# Patient Record
Sex: Male | Born: 1960 | Race: White | Hispanic: No | Marital: Married | Smoking: Never smoker
Health system: Southern US, Community
[De-identification: ages and names within clinical notes are randomized; demographics above are authoritative.]

## PROBLEM LIST (undated history)

## (undated) DIAGNOSIS — I10 Essential (primary) hypertension: Secondary | ICD-10-CM

## (undated) DIAGNOSIS — I480 Paroxysmal atrial fibrillation: Secondary | ICD-10-CM

## (undated) DIAGNOSIS — E785 Hyperlipidemia, unspecified: Secondary | ICD-10-CM

## (undated) HISTORY — DX: Essential (primary) hypertension: I10

## (undated) HISTORY — DX: Hyperlipidemia, unspecified: E78.5

## (undated) HISTORY — DX: Paroxysmal atrial fibrillation: I48.0

---

## 1999-02-14 ENCOUNTER — Encounter: Payer: Self-pay | Admitting: Neurosurgery

## 1999-02-14 ENCOUNTER — Ambulatory Visit (HOSPITAL_COMMUNITY): Admission: RE | Admit: 1999-02-14 | Discharge: 1999-02-14 | Payer: Self-pay

## 2000-04-27 ENCOUNTER — Ambulatory Visit (HOSPITAL_COMMUNITY): Admission: RE | Admit: 2000-04-27 | Discharge: 2000-04-27 | Payer: Self-pay | Admitting: Neurosurgery

## 2000-04-27 ENCOUNTER — Encounter: Payer: Self-pay | Admitting: Neurosurgery

## 2003-11-24 ENCOUNTER — Encounter: Admission: RE | Admit: 2003-11-24 | Discharge: 2003-11-24 | Payer: Self-pay | Admitting: Neurosurgery

## 2004-05-29 ENCOUNTER — Encounter: Admission: RE | Admit: 2004-05-29 | Discharge: 2004-05-29 | Payer: Self-pay | Admitting: Neurosurgery

## 2005-06-25 ENCOUNTER — Ambulatory Visit: Payer: Self-pay

## 2005-07-09 ENCOUNTER — Ambulatory Visit: Payer: Self-pay

## 2005-07-09 ENCOUNTER — Encounter: Payer: Self-pay | Admitting: Cardiology

## 2005-07-26 ENCOUNTER — Ambulatory Visit: Payer: Self-pay | Admitting: Internal Medicine

## 2005-08-17 ENCOUNTER — Ambulatory Visit: Payer: Self-pay | Admitting: Internal Medicine

## 2005-12-13 ENCOUNTER — Ambulatory Visit: Payer: Self-pay | Admitting: Internal Medicine

## 2006-05-25 ENCOUNTER — Ambulatory Visit: Payer: Self-pay | Admitting: Internal Medicine

## 2007-07-21 ENCOUNTER — Ambulatory Visit: Payer: Self-pay | Admitting: Internal Medicine

## 2007-07-21 LAB — CONVERTED CEMR LAB
ALT: 32 units/L (ref 0–53)
AST: 22 units/L (ref 0–37)
Albumin: 4 g/dL (ref 3.5–5.2)
Alkaline Phosphatase: 72 units/L (ref 39–117)
Bilirubin, Direct: 0.2 mg/dL (ref 0.0–0.3)
Cholesterol: 144 mg/dL (ref 0–200)
HDL: 50.7 mg/dL (ref >39.0)
LDL Cholesterol: 84 mg/dL (ref 0–99)
Total Bilirubin: 1.1 mg/dL (ref 0.3–1.2)
Total CHOL/HDL Ratio: 2.8
Total Protein: 6.8 g/dL (ref 6.0–8.3)
Triglycerides: 49 mg/dL (ref 0–149)
VLDL: 10 mg/dL (ref 0–40)

## 2009-03-05 DIAGNOSIS — I4891 Unspecified atrial fibrillation: Secondary | ICD-10-CM | POA: Insufficient documentation

## 2009-03-05 DIAGNOSIS — E785 Hyperlipidemia, unspecified: Secondary | ICD-10-CM

## 2009-03-05 DIAGNOSIS — I10 Essential (primary) hypertension: Secondary | ICD-10-CM | POA: Insufficient documentation

## 2009-03-07 ENCOUNTER — Ambulatory Visit: Payer: Self-pay | Admitting: Internal Medicine

## 2010-11-03 NOTE — Assessment & Plan Note (Signed)
[facility]                         ELECTROPHYSIOLOGY OFFICE NOTE   NAME:Colin Stanley, Colin Stanley                    MRN:          [id number]  DATE:07/21/2007                            DOB:          May 28, 1961    HISTORY OF PRESENT ILLNESS:  Colin Stanley returns today in follow-up.  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  I saw him last just over a year  ago.  He has done well.  He has had very minimal palpitations.  He  otherwise has no specific complaints.  He exercises several times a  week.   MEDICATIONS:  1. Vytorin 10/20.  2. Toprol XL 75 a day.  3. Aspirin 325 a day.  4. Fish oil supplements.   PHYSICAL EXAMINATION:  GENERAL:  He is a pleasant well-appearing man in  no distress.  He looks younger than his stated age.  VITAL SIGNS:  Blood pressure was 132/77, pulse 58 regular, respirations  were 18.  Weight was 226 pounds.  NECK:  Revealed no jugular distention.  There is no thyromegaly.  LUNGS:  Clear bilaterally auscultation.  No wheezes, rales or rhonchi  are present.  CARDIAC:  Regular rate and rhythm.  Normal S1-S2 no murmurs, rubs,  gallops present.  ABDOMEN:  Soft, nontender.  EXTREMITIES:  Demonstrate no edema.   STUDIES:  EKG demonstrates sinus rhythm with normal axis and intervals.   IMPRESSION:  1. Paroxysmal atrial fibrillation.  2. Well-controlled hypertension.   DISCUSSION:  Overall, Colin Stanley is stable and has maintained sinus  rhythm very nicely.  He has had no symptomatic A fib in the last several  months.  I will plan to see him back in a year.  It is time for a  fasting lipid and a liver panel.  Will have this obtained today and make  adjustments in his Vytorin dose as needed.     Doylene Canning. Ladona Ridgel, MD  Electronically Signed    GWT/MedQ  DD: 07/21/2007  DT: 07/21/2007  Job #: [id number]   cc:   Merlene Laughter. Renae Gloss, M.D.

## 2010-11-06 NOTE — Assessment & Plan Note (Signed)
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  His symptoms of palpitations and atrial fibrillation have  resolved.  He had one brief fleeting episode of chest discomfort, which  was not related to exertion.  He returns today for followup and overall  feels well.   PHYSICAL EXAMINATION:  GENERAL:  He is a pleasant, well-appearing,  middle-aged man in no acute distress.  VITAL SIGNS:  Blood pressure was 118/76, pulse 70 and regular,  respirations 18, and the weight was 223 pounds.  NECK:  No jugular venous distention.  LUNGS:  Clear bilaterally to auscultation.  CARDIOVASCULAR:  A regular rate and rhythm with normal S1 and S2.  EXTREMITIES:  Demonstrated no edema.   ELECTROCARDIOGRAM:  Sinus rhythm with normal axis and intervals.   IMPRESSION:  1. Paroxysmal atrial fibrillation, now well controlled.  2. Well-controlled hypertension with normal heart.  3. Dyslipidemia on Vytorin.   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  He is scheduled to see Dr.  Renae Gloss is approximately 1 month, and at that time will undergoing  fasting lipids and a liver panel.  I will plan to see him back in 1  year; sooner should he have symptomatic palpitations.     Doylene Canning. Ladona Ridgel, MD  Electronically Signed    GWT/MedQ  DD: 05/25/2006  DT: 05/26/2006  Job #: [id number]   cc:   Merlene Laughter. Renae Gloss, M.D.

## 2010-12-25 ENCOUNTER — Other Ambulatory Visit: Payer: Self-pay | Admitting: *Deleted

## 2010-12-25 MED ORDER — METOPROLOL SUCCINATE ER 50 MG PO TB24: ORAL_TABLET | ORAL | Status: DC

## 2011-04-26 ENCOUNTER — Other Ambulatory Visit: Payer: Self-pay

## 2011-04-26 MED ORDER — METOPROLOL SUCCINATE ER 50 MG PO TB24: ORAL_TABLET | ORAL | Status: DC

## 2011-06-07 ENCOUNTER — Other Ambulatory Visit: Payer: Self-pay | Admitting: Internal Medicine

## 2011-12-13 ENCOUNTER — Other Ambulatory Visit: Payer: Self-pay | Admitting: Internal Medicine

## 2012-05-21 ENCOUNTER — Other Ambulatory Visit: Payer: Self-pay | Admitting: Internal Medicine

## 2012-06-22 ENCOUNTER — Other Ambulatory Visit: Payer: Self-pay | Admitting: Internal Medicine

## 2012-08-03 ENCOUNTER — Other Ambulatory Visit: Payer: Self-pay | Admitting: Internal Medicine

## 2012-08-11 ENCOUNTER — Other Ambulatory Visit: Payer: Self-pay | Admitting: *Deleted

## 2012-08-11 MED ORDER — METOPROLOL SUCCINATE ER 50 MG PO TB24: 50.0000 mg | ORAL_TABLET | Freq: Every day | ORAL | Status: DC

## 2012-08-11 NOTE — Telephone Encounter (Signed)
Dr Ladona Ridgel says ok to refill for 3 months and get him in for an apt on the next Fri he is in the office.  Melissa has left a message for the patient to call and schedule the apt.

## 2012-10-11 ENCOUNTER — Encounter: Payer: Self-pay | Admitting: Cardiology

## 2012-10-11 ENCOUNTER — Ambulatory Visit (INDEPENDENT_AMBULATORY_CARE_PROVIDER_SITE_OTHER): Payer: BC Managed Care – PPO | Admitting: Internal Medicine

## 2012-10-11 ENCOUNTER — Encounter: Payer: Self-pay | Admitting: Internal Medicine

## 2012-10-11 VITALS — BP 142/87 | HR 68 | Ht 74.5 in | Wt 230.0 lb

## 2012-10-11 DIAGNOSIS — I4891 Unspecified atrial fibrillation: Secondary | ICD-10-CM

## 2012-10-11 DIAGNOSIS — I10 Essential (primary) hypertension: Secondary | ICD-10-CM

## 2012-10-11 NOTE — Assessment & Plan Note (Signed)
His blood pressure is slightly elevated today. When he checks her blood pressure at home, he notes that typically runs in the 130 range, systolic. I've encouraged the patient to continue his current medical therapy, and maintain a low-sodium diet. We would consider switching him to carvedilol in the future if his blood pressure were to go up more.

## 2012-10-11 NOTE — Assessment & Plan Note (Signed)
He is maintaining sinus rhythm very nicely. His symptoms are well-controlled. He will continue his beta blocker therapy for now. No indication for additional antiarrhythmic drug therapy. If his symptoms were to worsen, I've asked the patient to call us and we would have him wear a cardiac monitor.

## 2012-10-11 NOTE — Progress Notes (Signed)
HPI Colin Stanley returns today for followup. He is a pleasant 52 yo man with a h/o HTN, palpitations and documented PAF vs NS atrial tachycardia. His symptoms have been stable for many years. He does have hypertension and for the most part his blood pressure is well controlled. He uses his beta blocker to help control his atrial arrhythmias as well. He denies syncope, and is exercising several times a week. He has gained 10 or 15 pounds, although he thinks much of his weight gain is increased muscle mass. Overall he feels well. No chest pain or shortness of breath. Allergies  Allergen Reactions  . Latex      Current Outpatient Prescriptions  Medication Sig Dispense Refill  . aspirin 81 MG tablet Take 81 mg by mouth daily.      . metoprolol succinate (TOPROL-XL) 50 MG 24 hr tablet Take 1 tablet (50 mg total) by mouth daily. Take with or immediately following a meal.  45 tablet  3   No current facility-administered medications for this visit.     Past Medical History  Diagnosis Date  . Paroxysmal atrial fibrillation   . Dyslipidemia   . Hypertension     ROS:   All systems reviewed and negative except as noted in the HPI.   History reviewed. No pertinent past surgical history.   Family History  Problem Relation Age of Onset  . Hypertension Mother   . Crohn's disease Father   . Hypertension Sister      History   Social History  . Marital Status: Married    Spouse Name: N/A    Number of Children: N/A  . Years of Education: N/A   Occupational History  . Not on file.   Social History Main Topics  . Smoking status: Never Smoker   . Smokeless tobacco: Never Used  . Alcohol Use: Yes     Comment: Rare drinker  . Drug Use: No  . Sexually Active: Not on file   Other Topics Concern  . Not on file   Social History Narrative  . No narrative on file     BP 142/87  Pulse 68  Ht 6' 2.5" (1.892 m)  Wt 230 lb (104.327 kg)  BMI 29.14 kg/m2  Physical Exam:  Well  appearing 52 year old man,NAD HEENT: Unremarkable Neck:  No JVD, no thyromegally Back:  No CVA tenderness Lungs:  Clear with no wheezes, rales, or rhonchi. HEART:  Regular rate rhythm, no murmurs, no rubs, no clicks Abd:  soft, positive bowel sounds, no organomegally, no rebound, no guarding Ext:  2 plus pulses, no edema, no cyanosis, no clubbing Skin:  No rashes no nodules Neuro:  CN II through XII intact, motor grossly intact  EKG - normal sinus rhythm  Assess/Plan:

## 2012-10-11 NOTE — Patient Instructions (Signed)
Your physician wants you to follow-up in: 12 months with Dr Court Joy will receive a reminder letter in the mail two months in advance. If you don't receive a letter, please call our office to schedule the follow-up appointment.  Cardiac Calcium Scoring--- see instruction sheet   .

## 2012-10-27 ENCOUNTER — Ambulatory Visit (INDEPENDENT_AMBULATORY_CARE_PROVIDER_SITE_OTHER)
Admission: RE | Admit: 2012-10-27 | Discharge: 2012-10-27 | Disposition: A | Discharge status: Home / Self Care | Payer: Self-pay | Source: Ambulatory Visit | Attending: Internal Medicine | Admitting: Internal Medicine

## 2012-10-27 DIAGNOSIS — I4891 Unspecified atrial fibrillation: Secondary | ICD-10-CM

## 2013-03-11 ENCOUNTER — Other Ambulatory Visit: Payer: Self-pay | Admitting: Internal Medicine

## 2013-03-12 ENCOUNTER — Other Ambulatory Visit: Payer: Self-pay

## 2013-03-12 MED ORDER — METOPROLOL SUCCINATE ER 50 MG PO TB24: 50.0000 mg | ORAL_TABLET | Freq: Every day | ORAL | Status: DC

## 2013-09-28 ENCOUNTER — Other Ambulatory Visit: Payer: Self-pay | Admitting: Internal Medicine

## 2013-10-19 ENCOUNTER — Encounter: Payer: Self-pay | Admitting: Internal Medicine

## 2013-10-19 ENCOUNTER — Encounter (INDEPENDENT_AMBULATORY_CARE_PROVIDER_SITE_OTHER): Payer: Self-pay

## 2013-10-19 ENCOUNTER — Ambulatory Visit (INDEPENDENT_AMBULATORY_CARE_PROVIDER_SITE_OTHER): Payer: PRIVATE HEALTH INSURANCE | Admitting: Internal Medicine

## 2013-10-19 VITALS — BP 141/89 | HR 68 | Ht 75.0 in | Wt 232.6 lb

## 2013-10-19 DIAGNOSIS — I10 Essential (primary) hypertension: Secondary | ICD-10-CM

## 2013-10-19 DIAGNOSIS — I4891 Unspecified atrial fibrillation: Secondary | ICD-10-CM

## 2013-10-19 NOTE — Assessment & Plan Note (Signed)
His blood pressure is reasonably well controlled. Will recheck in several months. I have encouraged the patient to lose some weight.

## 2013-10-19 NOTE — Progress Notes (Signed)
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 His symptoms have been stable for many years. He does have hypertension and for the most part his blood pressure is well controlled. He uses his beta blocker to help control his atrial arrhythmias as well. He denies syncope, and is exercising several times a week. He has gained 10 pounds, due to dietary indiscretion. Overall he feels well. No chest pain or shortness of breath. About once a month he notes that he is awakened with the sensation that his heart is pounding. This lasts for a minute or more. Allergies  Allergen Reactions  . Latex      Current Outpatient Prescriptions  Medication Sig Dispense Refill  . aspirin 81 MG tablet Take 81 mg by mouth daily.      . metoprolol succinate (TOPROL-XL) 50 MG 24 hr tablet TAKE 1 TABLET BY MOUTH DAILY WITH OR IMMEDIATELY FOLLOWING A MEAL  30 tablet  3  . Omega-3 Fatty Acids (FISH OIL) 300 MG CAPS Take 1 capsule by mouth daily.       No current facility-administered medications for this visit.     Past Medical History  Diagnosis Date  . Paroxysmal atrial fibrillation   . Dyslipidemia   . Hypertension     ROS:   All systems reviewed and negative except as noted in the HPI.   History reviewed. No pertinent past surgical history.   Family History  Problem Relation Age of Onset  . Hypertension Mother   . Crohn's disease Father   . Hypertension Sister      History   Social History  . Marital Status: Married    Spouse Name: N/A    Number of Children: N/A  . Years of Education: N/A   Occupational History  . Not on file.   Social History Main Topics  . Smoking status: Never Smoker   . Smokeless tobacco: Never Used  . Alcohol Use: Yes     Comment: Rare drinker  . Drug Use: No  . Sexual Activity: Not on file   Other Topics Concern  . Not on file   Social History Narrative  . No  narrative on file     BP 141/89  Pulse 68  Ht 6\' 3"  (1.905 m)  Wt 232 lb 9.6 oz (105.507 kg)  BMI 29.07 kg/m2  Physical Exam:  Well appearing 53 year old man,NAD HEENT: Unremarkable Neck:  No JVD, no thyromegally Back:  No CVA tenderness Lungs:  Clear with no wheezes, rales, or rhonchi. HEART:  Regular rate rhythm, no murmurs, no rubs, no clicks Abd:  soft, positive bowel sounds, no organomegally, no rebound, no guarding Ext:  2 plus pulses, no edema, no cyanosis, no clubbing Skin:  No rashes no nodules Neuro:  CN II through XII intact, motor grossly intact  EKG - normal sinus rhythm  Assess/Plan:

## 2013-10-19 NOTE — Patient Instructions (Signed)
Your physician wants you to follow-up in: 12 months with Dr. Taylor. You will receive a reminder letter in the mail two months in advance. If you don't receive a letter, please call our office to schedule the follow-up appointment.    

## 2013-10-19 NOTE — Assessment & Plan Note (Signed)
Currently no evidence that he is having any sustained atrial fib. I have asked him to continue his current meds.

## 2014-02-06 ENCOUNTER — Other Ambulatory Visit: Payer: Self-pay | Admitting: Internal Medicine

## 2014-06-10 ENCOUNTER — Other Ambulatory Visit: Payer: Self-pay | Admitting: Internal Medicine

## 2014-12-06 ENCOUNTER — Ambulatory Visit (INDEPENDENT_AMBULATORY_CARE_PROVIDER_SITE_OTHER): Payer: PRIVATE HEALTH INSURANCE | Admitting: Internal Medicine

## 2014-12-06 ENCOUNTER — Encounter: Payer: Self-pay | Admitting: Internal Medicine

## 2014-12-06 ENCOUNTER — Other Ambulatory Visit: Payer: Self-pay | Admitting: Internal Medicine

## 2014-12-06 VITALS — BP 132/78 | HR 69 | Ht 75.0 in | Wt 235.6 lb

## 2014-12-06 DIAGNOSIS — I480 Paroxysmal atrial fibrillation: Secondary | ICD-10-CM | POA: Diagnosis not present

## 2014-12-06 DIAGNOSIS — I10 Essential (primary) hypertension: Secondary | ICD-10-CM

## 2014-12-06 NOTE — Patient Instructions (Addendum)

## 2014-12-06 NOTE — Assessment & Plan Note (Addendum)
He is doing well with essentially no or very minimal symptoms. He will undergo watchful waiting. CHADSVASC is 2.

## 2014-12-06 NOTE — Progress Notes (Signed)
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 His symptoms have been stable for many years. He does have hypertension and for the most part his blood pressure is well controlled. He relates a single episodes of blood pressure of 190 but nothing near that otherwise. He uses his beta blocker to help control his atrial arrhythmias as well. He denies syncope, and is exercising several times a week. He has gained 7 pounds, due to dietary indiscretion. Overall he feels well. No chest pain or shortness of breath.  Allergies  Allergen Reactions  . Latex     Redness and rash     Current Outpatient Prescriptions  Medication Sig Dispense Refill  . aspirin 81 MG tablet Take 81 mg by mouth daily.    . CHONDROITIN SULFATE PO Take 1 tablet by mouth daily.    . metoprolol succinate (TOPROL-XL) 50 MG 24 hr tablet TAKE 1 TABLET BY MOUTH EVERY DAY WITH OR IMMEDIATELY FOLLOWING A MEAL 30 tablet 5  . Omega-3 Fatty Acids (FISH OIL) 300 MG CAPS Take 1 capsule by mouth daily.     No current facility-administered medications for this visit.     Past Medical History  Diagnosis Date  . Paroxysmal atrial fibrillation   . Dyslipidemia   . Hypertension     ROS:   All systems reviewed and negative except as noted in the HPI.   No past surgical history on file.   Family History  Problem Relation Age of Onset  . Hypertension Mother   . Other Mother     IgG deficiency  . Crohn's disease Father   . Lung cancer Father     Cause of death  . Cancer Father     prostate  . Hypertension Sister      History   Social History  . Marital Status: Married    Spouse Name: N/A  . Number of Children: N/A  . Years of Education: N/A   Occupational History  . Not on file.   Social History Main Topics  . Smoking status: Never Smoker   . Smokeless tobacco: Never Used  . Alcohol Use: Yes     Comment: Rare  drinker  . Drug Use: No  . Sexual Activity: Not on file   Other Topics Concern  . Not on file   Social History Narrative     BP 132/78 mmHg  Pulse 69  Ht 6\' 3"  (1.905 m)  Wt 235 lb 9.6 oz (106.867 kg)  BMI 29.45 kg/m2  Physical Exam:  Well appearing 54 year old man,NAD HEENT: Unremarkable Neck:  6 cm JVD, no thyromegally Back:  No CVA tenderness Lungs:  Clear with no wheezes, rales, or rhonchi. HEART:  Regular rate rhythm, no murmurs, no rubs, no clicks Abd:  soft, positive bowel sounds, no organomegally, no rebound, no guarding Ext:  2 plus pulses, no edema, no cyanosis, no clubbing Skin:  No rashes no nodules Neuro:  CN II through XII intact, motor grossly intact  EKG - normal sinus rhythm  Assess/Plan:

## 2014-12-06 NOTE — Assessment & Plan Note (Signed)
He has had no sustained atrial arrhythmias. He will undergo watchful waiting.

## 2014-12-06 NOTE — Assessment & Plan Note (Signed)
We have discussed statin therapy in the past. He is not inclined to try these medications. He is encouraged to lose weight.

## 2015-05-29 ENCOUNTER — Other Ambulatory Visit: Payer: Self-pay | Admitting: Internal Medicine

## 2015-12-15 ENCOUNTER — Other Ambulatory Visit: Payer: Self-pay | Admitting: Internal Medicine

## 2015-12-16 ENCOUNTER — Other Ambulatory Visit: Payer: Self-pay | Admitting: *Deleted

## 2015-12-16 MED ORDER — METOPROLOL SUCCINATE ER 50 MG PO TB24: 50.0000 mg | ORAL_TABLET | Freq: Every day | ORAL | Status: DC

## 2016-06-11 ENCOUNTER — Other Ambulatory Visit: Payer: Self-pay | Admitting: Internal Medicine

## 2016-07-15 ENCOUNTER — Other Ambulatory Visit: Payer: Self-pay | Admitting: *Deleted

## 2016-07-15 MED ORDER — METOPROLOL SUCCINATE ER 50 MG PO TB24: ORAL_TABLET | ORAL | 0 refills | Status: DC

## 2016-07-15 NOTE — Telephone Encounter (Signed)
metoprolol succinate (TOPROL-XL) 50 MG 24 hr tablet  Medication  Date: 06/11/2016 [facility] Ordering/Authorizing: Marinus MawGregg W Taylor, MD  Order Providers   Prescribing Provider Encounter Provider  Marinus MawGregg W Taylor, MD Marinus MawGregg W Taylor, MD  Medication Detail    Disp Refills Start End   metoprolol succinate (TOPROL-XL) 50 MG 24 hr tablet 30 tablet 0 06/11/2016    Sig: TAKE 1 TABLET BY MOUTH EVERY DAY WITH OR IMMEDIATELY FOLLOWING A MEAL   Notes to Pharmacy: NEED TO MAKE ANNUAL APPOINTMENT [phone]   E-Prescribing Status: Receipt confirmed by pharmacy (06/11/2016 10:08 AM EST)   Pharmacy   [id number] - [place], [place] - [address]

## 2016-07-30 ENCOUNTER — Other Ambulatory Visit: Payer: Self-pay | Admitting: Internal Medicine

## 2016-08-06 ENCOUNTER — Encounter: Payer: Self-pay | Admitting: Internal Medicine

## 2016-08-06 ENCOUNTER — Encounter (INDEPENDENT_AMBULATORY_CARE_PROVIDER_SITE_OTHER): Payer: Self-pay

## 2016-08-06 ENCOUNTER — Ambulatory Visit (INDEPENDENT_AMBULATORY_CARE_PROVIDER_SITE_OTHER): Payer: PRIVATE HEALTH INSURANCE | Admitting: Internal Medicine

## 2016-08-06 VITALS — BP 132/82 | HR 73 | Ht 75.0 in | Wt 229.4 lb

## 2016-08-06 DIAGNOSIS — I480 Paroxysmal atrial fibrillation: Secondary | ICD-10-CM

## 2016-08-06 DIAGNOSIS — I10 Essential (primary) hypertension: Secondary | ICD-10-CM | POA: Diagnosis not present

## 2016-08-06 MED ORDER — METOPROLOL SUCCINATE ER 50 MG PO TB24: ORAL_TABLET | ORAL | 3 refills | Status: DC

## 2016-08-06 NOTE — Progress Notes (Signed)
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 His symptoms have been stable for many years. He does have hypertension and for the most part his blood pressure is well controlled. He uses his beta blocker to help control his atrial arrhythmias as well. He denies syncope, and is exercising several times a week. No chest pain or shortness of breath. He notes episodic periods of fatigue which are not associated with palpitations. Allergies  Allergen Reactions  . Latex     Redness and rash     Current Outpatient Prescriptions  Medication Sig Dispense Refill  . aspirin 81 MG tablet Take 81 mg by mouth daily.    . B Complex Vitamins (B COMPLEX PO) Take 1 tablet by mouth daily.    . Cholecalciferol (VITAMIN D PO) Take 1 tablet by mouth daily.    . metoprolol succinate (TOPROL-XL) 50 MG 24 hr tablet TAKE 1 TABLET BY MOUTH EVERY DAY WITH OR IMMEDIATELY FOLLOWING A MEAL 7 tablet 0  . Omega-3 Fatty Acids (FISH OIL) 300 MG CAPS Take 1 capsule by mouth daily.     No current facility-administered medications for this visit.      Past Medical History:  Diagnosis Date  . Dyslipidemia   . Hypertension   . Paroxysmal atrial fibrillation (HCC)     ROS:   All systems reviewed and negative except as noted in the HPI.   No past surgical history on file.   Family History  Problem Relation Age of Onset  . Hypertension Mother   . Other Mother     IgG deficiency  . Crohn's disease Father   . Lung cancer Father     Cause of death  . Cancer Father     prostate  . Hypertension Sister      Social History   Social History  . Marital status: Married    Spouse name: N/A  . Number of children: N/A  . Years of education: N/A   Occupational History  . Not on file.   Social History Main Topics  . Smoking status: Never Smoker  . Smokeless tobacco: Never Used  . Alcohol use Yes     Comment:  Rare drinker  . Drug use: No  . Sexual activity: Not on file   Other Topics Concern  . Not on file   Social History Narrative  . No narrative on file     BP 132/82   Pulse 73   Ht 6\' 3"  (1.905 m)   Wt 229 lb 6.4 oz (104.1 kg)   BMI 28.67 kg/m   Physical Exam:  Well appearing 56 year old man,NAD HEENT: Unremarkable Neck:  6 cm JVD, no thyromegally Back:  No CVA tenderness Lungs:  Clear with no wheezes, rales, or rhonchi. HEART:  Regular rate rhythm, no murmurs, no rubs, no clicks Abd:  soft, positive bowel sounds, no organomegally, no rebound, no guarding Ext:  2 plus pulses, no edema, no cyanosis, no clubbing Skin:  No rashes no nodules Neuro:  CN II through XII intact, motor grossly intact  EKG - normal sinus rhythm  Assess/Plan: 1. PAF vs atrial tachy - his arrhythmias have been well controlled. He will continue his current meds. 2. HTN - his blood pressure is minimally elevated. He will continue Toprol. He is encouraged to lose more weight. 3. Dyslipidemia - he has recently had cholesterol checked at his primary MD.  I have asked him to get me a copy of the results.  Leonia ReevesGregg Caleb Prigmore,M.D.

## 2016-08-06 NOTE — Patient Instructions (Signed)

## 2017-08-08 ENCOUNTER — Other Ambulatory Visit: Payer: Self-pay | Admitting: Internal Medicine

## 2017-09-05 ENCOUNTER — Other Ambulatory Visit: Payer: Self-pay

## 2017-09-05 MED ORDER — METOPROLOL SUCCINATE ER 50 MG PO TB24: ORAL_TABLET | ORAL | 0 refills | Status: DC

## 2017-10-08 ENCOUNTER — Other Ambulatory Visit: Payer: Self-pay | Admitting: Internal Medicine

## 2017-10-10 ENCOUNTER — Other Ambulatory Visit: Payer: Self-pay

## 2017-10-10 MED ORDER — METOPROLOL SUCCINATE ER 50 MG PO TB24
50.0000 mg | ORAL_TABLET | Freq: Every day | ORAL | 0 refills | Status: DC
Start: 2017-10-10 — End: 2018-01-01

## 2017-12-09 ENCOUNTER — Encounter (INDEPENDENT_AMBULATORY_CARE_PROVIDER_SITE_OTHER): Payer: Self-pay

## 2017-12-09 ENCOUNTER — Ambulatory Visit: Payer: PRIVATE HEALTH INSURANCE | Admitting: Internal Medicine

## 2017-12-09 ENCOUNTER — Encounter: Payer: Self-pay | Admitting: Internal Medicine

## 2017-12-09 VITALS — BP 120/82 | HR 62 | Ht 74.0 in | Wt 227.0 lb

## 2017-12-09 DIAGNOSIS — I480 Paroxysmal atrial fibrillation: Secondary | ICD-10-CM | POA: Diagnosis not present

## 2017-12-09 DIAGNOSIS — I10 Essential (primary) hypertension: Secondary | ICD-10-CM | POA: Diagnosis not present

## 2017-12-09 NOTE — Patient Instructions (Signed)

## 2017-12-09 NOTE — Progress Notes (Signed)
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 His symptoms have been stable for many years. He does have hypertension and for the most part his blood pressure is well controlled. He uses his beta blocker to help control his atrial arrhythmias as well. He denies syncope, and is exercising several times a week. No chest pain or shortness of breath.   Allergies  Allergen Reactions  . Latex     Redness and rash     Current Outpatient Medications  Medication Sig Dispense Refill  . aspirin 81 MG tablet Take 81 mg by mouth daily.    . metoprolol succinate (TOPROL-XL) 50 MG 24 hr tablet Take 1 tablet (50 mg total) by mouth daily. Take with or immediately following a meal. Please keep appointment for refills thanks. 90 tablet 0   No current facility-administered medications for this visit.      Past Medical History:  Diagnosis Date  . Dyslipidemia   . Hypertension   . Paroxysmal atrial fibrillation (HCC)     ROS:   All systems reviewed and negative except as noted in the HPI.   History reviewed. No pertinent surgical history.   Family History  Problem Relation Age of Onset  . Hypertension Mother   . Other Mother        IgG deficiency  . Crohn's disease Father   . Lung cancer Father        Cause of death  . Cancer Father        prostate  . Hypertension Sister      Social History   Socioeconomic History  . Marital status: Married    Spouse name: Not on file  . Number of children: Not on file  . Years of education: Not on file  . Highest education level: Not on file  Occupational History  . Not on file  Social Needs  . Financial resource strain: Not on file  . Food insecurity:    Worry: Not on file    Inability: Not on file  . Transportation needs:    Medical: Not on file    Non-medical: Not on file  Tobacco Use  . Smoking status: Never Smoker  .  Smokeless tobacco: Never Used  Substance and Sexual Activity  . Alcohol use: Yes    Comment: Rare drinker  . Drug use: No  . Sexual activity: Not on file  Lifestyle  . Physical activity:    Days per week: Not on file    Minutes per session: Not on file  . Stress: Not on file  Relationships  . Social connections:    Talks on phone: Not on file    Gets together: Not on file    Attends religious service: Not on file    Active member of club or organization: Not on file    Attends meetings of clubs or organizations: Not on file    Relationship status: Not on file  . Intimate partner violence:    Fear of current or ex partner: Not on file    Emotionally abused: Not on file    Physically abused: Not on file    Forced sexual activity: Not on file  Other Topics Concern  . Not on file  Social History Narrative  . Not on file     BP 120/82   Pulse 62   Ht 6'  2" (1.88 m)   Wt 227 lb (103 kg)   SpO2 98%   BMI 29.15 kg/m   Physical Exam:  Well appearing NAD HEENT: Unremarkable Neck:  No JVD, no thyromegally Lymphatics:  No adenopathy Back:  No CVA tenderness Lungs:  Clear HEART:  Regular rate rhythm, no murmurs, no rubs, no clicks Abd:  soft, positive bowel sounds, no organomegally, no rebound, no guarding Ext:  2 plus pulses, no edema, no cyanosis, no clubbing Skin:  No rashes no nodules Neuro:  CN II through XII intact, motor grossly intact  EKG - nsr   Assess/Plan: 1. HTN - his blood pressure is well controlled today. His weight is down and he notes that he has been under more stress.  2. Palpitations - these are much improved. We will follow. 3. Dyslipidemia - he is pending labs today when he goes for his physical.   Leonia ReevesGregg Mistie Adney,M.D.

## 2018-01-01 ENCOUNTER — Other Ambulatory Visit: Payer: Self-pay | Admitting: Internal Medicine

## 2019-01-04 ENCOUNTER — Other Ambulatory Visit: Payer: Self-pay

## 2019-01-04 MED ORDER — METOPROLOL SUCCINATE ER 50 MG PO TB24: 50.0000 mg | ORAL_TABLET | Freq: Every day | ORAL | 0 refills | Status: DC

## 2019-03-29 ENCOUNTER — Other Ambulatory Visit: Payer: Self-pay | Admitting: Internal Medicine

## 2019-04-09 ENCOUNTER — Other Ambulatory Visit: Payer: Self-pay | Admitting: Internal Medicine

## 2019-04-17 ENCOUNTER — Telehealth: Payer: Self-pay | Admitting: Internal Medicine

## 2019-04-17 MED ORDER — METOPROLOL SUCCINATE ER 50 MG PO TB24: 50.0000 mg | ORAL_TABLET | Freq: Every day | ORAL | 0 refills | Status: DC

## 2019-04-17 NOTE — Telephone Encounter (Signed)
New Message   *STAT* If patient is at the pharmacy, call can be transferred to refill team.   1. Which medications need to be refilled? (please list name of each medication and dose if known) metoprolol succinate (TOPROL-XL) 50 MG 24 hr tablet  2. Which pharmacy/location (including street and city if local pharmacy) is medication to be sent to? [id number] - [place] [place] - [address]  3. Do they need a 30 day or 90 day supply? 90 day

## 2019-04-17 NOTE — Telephone Encounter (Signed)
Pt's medication was sent to pt's pharmacy as requested. Confirmation received.  °

## 2019-06-01 ENCOUNTER — Ambulatory Visit: Payer: PRIVATE HEALTH INSURANCE | Admitting: Internal Medicine

## 2019-06-01 ENCOUNTER — Encounter: Payer: Self-pay | Admitting: Internal Medicine

## 2019-06-01 ENCOUNTER — Other Ambulatory Visit: Payer: Self-pay

## 2019-06-01 VITALS — BP 134/86 | HR 63 | Ht 75.0 in | Wt 236.0 lb

## 2019-06-01 DIAGNOSIS — R002 Palpitations: Secondary | ICD-10-CM | POA: Diagnosis not present

## 2019-06-01 DIAGNOSIS — I10 Essential (primary) hypertension: Secondary | ICD-10-CM

## 2019-06-01 DIAGNOSIS — E785 Hyperlipidemia, unspecified: Secondary | ICD-10-CM | POA: Diagnosis not present

## 2019-06-01 NOTE — Progress Notes (Signed)
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 He is a pleasant 58 yo man with HTN and dyslipidemia and PAF. He has not had any symptoms. He remains active, working out 3 days a week. He has been active [place] the [place]. He has not had syncope. 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 He says his pants are fitting him looser.  Allergies  Allergen Reactions  . Latex     Redness and rash     Current Outpatient Medications  Medication Sig Dispense Refill  . aspirin 81 MG tablet Take 81 mg by mouth daily.    . metoprolol succinate (TOPROL-XL) 50 MG 24 hr tablet Take 1 tablet (50 mg total) by mouth daily. Please keep upcoming appt in December with Dr. Lovena Stanley before anymore refills. Thank you 90 tablet 0   No current facility-administered medications for this visit.     Past Medical History:  Diagnosis Date  . Dyslipidemia   . Hypertension   . Paroxysmal atrial fibrillation (HCC)     ROS:   All systems reviewed and negative except as noted in the HPI.   No past surgical history on file.   Family History  Problem Relation Age of Onset  . Hypertension Mother   . Other Mother        IgG deficiency  . Crohn's disease Father   . Lung cancer Father        Cause of death  . Cancer Father        prostate  . Hypertension Sister      Social History   Socioeconomic History  . Marital status: Married    Spouse name: Not on file  . Number of children: Not on file  . Years of education: Not on file  . Highest education level: Not on file  Occupational History  . Not on file  Tobacco Use  . Smoking status: Never Smoker  . Smokeless tobacco: Never Used  Substance and Sexual Activity  . Alcohol use: Yes    Comment: Rare drinker  . Drug use: No  . Sexual activity: Not on file  Other Topics Concern  . Not on file  Social History Narrative  . Not on file   Social Determinants of Health     Financial Resource Strain:   . Difficulty of Paying Living Expenses: Not on file  Food Insecurity:   . Worried About Charity fundraiser in the Last Year: Not on file  . Ran Out of Food in the Last Year: Not on file  Transportation Needs:   . Lack of Transportation (Medical): Not on file  . Lack of Transportation (Non-Medical): Not on file  Physical Activity:   . Days of Exercise per Week: Not on file  . Minutes of Exercise per Session: Not on file  Stress:   . Feeling of Stress : Not on file  Social Connections:   . Frequency of Communication with Friends and Family: Not on file  . Frequency of Social Gatherings with Friends and Family: Not on file  . Attends Religious Services: Not on file  . Active Member of Clubs or Organizations: Not on file  . Attends Archivist Meetings: Not on file  . Marital Status: Not on file  Intimate Partner Violence:   . Fear of Current or Ex-Partner: Not on file  . Emotionally Abused: Not  on file  . Physically Abused: Not on file  . Sexually Abused: Not on file     BP 134/86   Pulse 63   Ht 6\' 3"  (1.905 m)   Wt 236 lb (107 kg)   SpO2 95%   BMI 29.50 kg/m   Physical Exam:  Well appearing NAD HEENT: Unremarkable Neck:  No JVD, no thyromegally Lymphatics:  No adenopathy Back:  No CVA tenderness Lungs:  Clear with no wheezes HEART:  Regular rate rhythm, no murmurs, no rubs, no clicks Abd:  soft, positive bowel sounds, no organomegally, no rebound, no guarding Ext:  2 plus pulses, no edema, no cyanosis, no clubbing Skin:  No rashes no nodules Neuro:  CN II through XII intact, motor grossly intact  EKG - nsr  Assess/Plan: 1. PAF - he is maintaining NSR and has been essentially asymptomatic. He will continue his low dose beta blocker and ASA. 2. Dyslipidemia - he has a h/o dyslipidemia. He is not on a statin. He was supposed to have fasting lipids checked but I do not have the results. We will plan to check when we see him  back. 3. HTN - he has borderline HTN. When he turns 65, we will consider systemic anti-coagulation.  .D.

## 2019-06-01 NOTE — Patient Instructions (Signed)

## 2019-07-08 ENCOUNTER — Other Ambulatory Visit: Payer: Self-pay | Admitting: Internal Medicine

## 2019-09-10 ENCOUNTER — Other Ambulatory Visit: Payer: Self-pay | Admitting: Orthopedic Surgery

## 2019-09-10 DIAGNOSIS — M25562 Pain in left knee: Secondary | ICD-10-CM

## 2019-09-10 DIAGNOSIS — M25462 Effusion, left knee: Secondary | ICD-10-CM

## 2020-04-25 ENCOUNTER — Encounter: Payer: Self-pay | Admitting: Internal Medicine

## 2020-04-25 ENCOUNTER — Ambulatory Visit (INDEPENDENT_AMBULATORY_CARE_PROVIDER_SITE_OTHER): Payer: PRIVATE HEALTH INSURANCE | Admitting: Internal Medicine

## 2020-04-25 ENCOUNTER — Other Ambulatory Visit: Payer: Self-pay

## 2020-04-25 VITALS — BP 132/74 | HR 67 | Ht 75.0 in | Wt 227.4 lb

## 2020-04-25 DIAGNOSIS — I10 Essential (primary) hypertension: Secondary | ICD-10-CM

## 2020-04-25 DIAGNOSIS — I480 Paroxysmal atrial fibrillation: Secondary | ICD-10-CM

## 2020-04-25 DIAGNOSIS — E785 Hyperlipidemia, unspecified: Secondary | ICD-10-CM | POA: Diagnosis not present

## 2020-04-25 NOTE — Patient Instructions (Signed)

## 2020-04-25 NOTE — Progress Notes (Signed)
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 He has a h/o HTN and palpitations and has not had any evidence of sustained atrial fib. He denies chest pain and works out almost daily, once a week with a Psychologist, educational. He has had rare dizzy episodes. No syncope. No edema. No chest pain or sob. Allergies  Allergen Reactions  . Latex     Redness and rash     Current Outpatient Medications  Medication Sig Dispense Refill  . aspirin 81 MG tablet Take 81 mg by mouth daily.    . finasteride (PROPECIA) 1 MG tablet Take 1 mg by mouth daily.    . metoprolol succinate (TOPROL-XL) 50 MG 24 hr tablet Take 1 tablet (50 mg total) by mouth daily. 90 tablet 3  . omeprazole (PRILOSEC) 40 MG capsule Take 40 mg by mouth as needed for heartburn.     No current facility-administered medications for this visit.     Past Medical History:  Diagnosis Date  . Dyslipidemia   . Hypertension   . Paroxysmal atrial fibrillation (HCC)     ROS:   All systems reviewed and negative except as noted in the HPI.   History reviewed. No pertinent surgical history.   Family History  Problem Relation Age of Onset  . Hypertension Mother   . Other Mother        IgG deficiency  . Crohn's disease Father   . Lung cancer Father        Cause of death  . Cancer Father        prostate  . Hypertension Sister      Social History   Socioeconomic History  . Marital status: Married    Spouse name: Not on file  . Number of children: Not on file  . Years of education: Not on file  . Highest education level: Not on file  Occupational History  . Not on file  Tobacco Use  . Smoking status: Never Smoker  . Smokeless tobacco: Never Used  Vaping Use  . Vaping Use: Never used  Substance and Sexual Activity  . Alcohol use: Yes    Comment: Rare drinker  . Drug use: No  . Sexual activity: Not on file  Other Topics Concern  . Not on file  Social History Narrative  . Not on file   Social Determinants of Health     Financial Resource Strain:   . Difficulty of Paying Living Expenses: Not on file  Food Insecurity:   . Worried About Programme researcher, broadcasting/film/video in the Last Year: Not on file  . Ran Out of Food in the Last Year: Not on file  Transportation Needs:   . Lack of Transportation (Medical): Not on file  . Lack of Transportation (Non-Medical): Not on file  Physical Activity:   . Days of Exercise per Week: Not on file  . Minutes of Exercise per Session: Not on file  Stress:   . Feeling of Stress : Not on file  Social Connections:   . Frequency of Communication with Friends and Family: Not on file  . Frequency of Social Gatherings with Friends and Family: Not on file  . Attends Religious Services: Not on file  . Active Member of Clubs or Organizations: Not on file  . Attends Banker Meetings: Not on file  . Marital Status: Not on file  Intimate Partner Violence:   . Fear of Current or Ex-Partner: Not on file  . Emotionally  Abused: Not on file  . Physically Abused: Not on file  . Sexually Abused: Not on file     BP 132/74   Pulse 67   Ht 6\' 3"  (1.905 m)   Wt 227 lb 6.4 oz (103.1 kg)   SpO2 97%   BMI 28.42 kg/m   Physical Exam:  Well appearing NAD HEENT: Unremarkable Neck:  No JVD, no thyromegally Lymphatics:  No adenopathy Back:  No CVA tenderness Lungs:  Clear with no wheezes HEART:  Regular rate rhythm, no murmurs, no rubs, no clicks Abd:  soft, positive bowel sounds, no organomegally, no rebound, no guarding Ext:  2 plus pulses, no edema, no cyanosis, no clubbing Skin:  No rashes no nodules Neuro:  CN II through XII intact, motor grossly intact  EKG - nsr  Assess/Plan: 1. HTN - his bp is well controlled. If he continues to lose weight, and bp down, he could reduce his dose of toprol to 25 mg daily. 2. Palps - these have been quiet. 3. Dyslipidemia - his fasting labs were done a couple of weeks ago at his primary MD's but not yet available to me.   Sherrika Weakland,MD

## 2020-07-05 ENCOUNTER — Other Ambulatory Visit: Payer: Self-pay | Admitting: Internal Medicine

## 2021-06-27 ENCOUNTER — Other Ambulatory Visit: Payer: Self-pay | Admitting: Internal Medicine

## 2021-07-23 ENCOUNTER — Other Ambulatory Visit: Payer: Self-pay | Admitting: Internal Medicine

## 2021-07-30 ENCOUNTER — Other Ambulatory Visit: Payer: Self-pay | Admitting: Internal Medicine

## 2021-08-20 ENCOUNTER — Ambulatory Visit (INDEPENDENT_AMBULATORY_CARE_PROVIDER_SITE_OTHER): Payer: PRIVATE HEALTH INSURANCE

## 2021-08-20 ENCOUNTER — Ambulatory Visit: Payer: PRIVATE HEALTH INSURANCE | Admitting: Internal Medicine

## 2021-08-20 ENCOUNTER — Other Ambulatory Visit: Payer: Self-pay

## 2021-08-20 VITALS — BP 132/64 | HR 63 | Ht 74.0 in | Wt 234.0 lb

## 2021-08-20 DIAGNOSIS — R002 Palpitations: Secondary | ICD-10-CM

## 2021-08-20 DIAGNOSIS — I10 Essential (primary) hypertension: Secondary | ICD-10-CM | POA: Diagnosis not present

## 2021-08-20 DIAGNOSIS — E785 Hyperlipidemia, unspecified: Secondary | ICD-10-CM

## 2021-08-20 DIAGNOSIS — R0789 Other chest pain: Secondary | ICD-10-CM | POA: Diagnosis not present

## 2021-08-20 NOTE — Patient Instructions (Addendum)
Medication Instructions:  ?Your physician recommends that you continue on your current medications as directed. Please refer to the Current Medication list given to you today. ? ?Labwork: ?None ordered. ? ?Testing/Procedures: ?Your physician has recommended that you wear a holter monitor. Holter monitors are medical devices that record the heart?s electrical activity. Doctors most often use these monitors to diagnose arrhythmias. Arrhythmias are problems with the speed or rhythm of the heartbeat. The monitor is a small, portable device. You can wear one while you do your normal daily activities. This is usually used to diagnose what is causing palpitations/syncope (passing out). ? ?You will wear a 14 day monitor ? ?Please schedule for a calcium score CT ? ?Follow-Up: ?Your physician wants you to follow-up in: one year with Cristopher Peru, MD or earlier based on results of testing. ? ? ?Any Other Special Instructions Will Be Listed Below (If Applicable). ? ?If you need a refill on your cardiac medications before your next appointment, please call your pharmacy.  ? ?Your physician has recommended that you wear a Zio monitor.  ? ?This monitor is a medical device that records the heart?s electrical activity. Doctors most often use these monitors to diagnose arrhythmias. Arrhythmias are problems with the speed or rhythm of the heartbeat. The monitor is a small device applied to your chest. You can wear one while you do your normal daily activities. While wearing this monitor if you have any symptoms to push the button and record what you felt. Once you have worn this monitor for the period of time provider prescribed (Usually 14 days), you will return the monitor device in the postage paid box. Once it is returned they will download the data collected and provide [place] with a report which the provider will then review and we will call you with those results. Important tips: ? ?Avoid showering during the first 24 hours of  wearing the monitor. ?Avoid excessive sweating to help maximize wear time. ?Do not submerge the device, no hot tubs, and no swimming pools. ?Keep any lotions or oils away from the patch. ?After 24 hours you may shower with the patch on. Take brief showers with your back facing the shower head.  ?Do not remove patch once it has been placed because that will interrupt data and decrease adhesive wear time. ?Push the button when you have any symptoms and write down what you were feeling. ?Once you have completed wearing your monitor, remove and place into box which has postage paid and place in your outgoing mailbox.  ?If for some reason you have misplaced your box then call our office and we can provide another box and/or mail it off for you. ? ?  ? ? ? ?

## 2021-08-20 NOTE — Progress Notes (Signed)
? ? ? ? 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 He has a h/o HTN and palpitations and has not had any evidence of sustained atrial fib. He denies chest pain and works out almost daily, once a week with a Psychologist, educational. He has had rare dizzy episodes. No syncope. No edema. He describes an episode of chest pressure when he was visiting his mother down in [place]. It resolved after several hours and traveled into the abdominal region. He has subsequently exercised vigorously on multiple occaisions without any symptoms. Previously he had a calcium score of zero about 8 years ago. ?Allergies  ?Allergen Reactions  ? Latex   ?  Redness and rash  ? ? ? ?Current Outpatient Medications  ?Medication Sig Dispense Refill  ? aspirin 81 MG tablet Take 81 mg by mouth daily.    ? finasteride (PROPECIA) 1 MG tablet Take 1 mg by mouth daily.    ? metoprolol succinate (TOPROL-XL) 50 MG 24 hr tablet Take 1 tablet (50 mg total) by mouth daily. Please call our office to schedule an overdue appointment with Dr. Ladona Ridgel before anymore refills. [phone]. Thank you 2nd attempt 15 tablet 0  ? omeprazole (PRILOSEC) 40 MG capsule Take 40 mg by mouth as needed for heartburn.    ? ?No current facility-administered medications for this visit.  ? ? ? ?Past Medical History:  ?Diagnosis Date  ? Dyslipidemia   ? Hypertension   ? Paroxysmal atrial fibrillation (HCC)   ? ? ?ROS: ? ? All systems reviewed and negative except as noted in the HPI. ? ? ?No past surgical history on file. ? ? ?Family History  ?Problem Relation Age of Onset  ? Hypertension Mother   ? Other Mother   ?     IgG deficiency  ? Crohn's disease Father   ? Lung cancer Father   ?     Cause of death  ? Cancer Father   ?     prostate  ? Hypertension Sister   ? ? ? ?Social History  ? ?Socioeconomic History  ? Marital status: Married  ?  Spouse name: Not on file  ? Number of children: Not on file  ? Years of education: Not on file  ? Highest education level: Not on file   ?Occupational History  ? Not on file  ?Tobacco Use  ? Smoking status: Never  ? Smokeless tobacco: Never  ?Vaping Use  ? Vaping Use: Never used  ?Substance and Sexual Activity  ? Alcohol use: Yes  ?  Comment: Rare drinker  ? Drug use: No  ? Sexual activity: Not on file  ?Other Topics Concern  ? Not on file  ?Social History Narrative  ? Not on file  ? ?Social Determinants of Health  ? ?Financial Resource Strain: Not on file  ?Food Insecurity: Not on file  ?Transportation Needs: Not on file  ?Physical Activity: Not on file  ?Stress: Not on file  ?Social Connections: Not on file  ?Intimate Partner Violence: Not on file  ? ? ? ?BP 132/64   Pulse 63   Ht 6\' 2"  (1.88 m)   Wt 234 lb (106.1 kg)   SpO2 96%   BMI 30.04 kg/m?  ? ?Physical Exam: ? ?Well appearing NAD ?HEENT: Unremarkable ?Neck:  No JVD, no thyromegally ?Lymphatics:  No adenopathy ?Back:  No CVA tenderness ?Lungs:  Clear with no wheezes ?HEART:  Regular rate rhythm, no murmurs, no rubs, no clicks ?Abd:  soft, positive bowel sounds, no  organomegally, no rebound, no guarding ?Ext:  2 plus pulses, no edema, no cyanosis, no clubbing ?Skin:  No rashes no nodules ?Neuro:  CN II through XII intact, motor grossly intact ? ?EKG ?nsr ? ?Assess/Plan:  ?1. HTN - his bp is well controlled. He is encouraged to lose weight.  No changed in meds for now. ?2. Palps - these have recently increased and I have asked him to where a Zio monitor. ?3. Dyslipidemia - his fasting labs were done a couple of weeks ago at his primary MD's and look ok. His LDL was elevated. ?4. Chest pain - 8 years ago he had a coronary calcium score of 0. His current symptoms are non-cardiac. I have asked him to obtain a coronary calcium score.  ? ?Colin Stanley ?  ?Colin Gowda Hansen Carino,MD ?

## 2021-08-20 NOTE — Progress Notes (Unsigned)
Enrolled for [employer] to mail a ZIO XT long term holter monitor to the patients address on file.  

## 2021-08-21 ENCOUNTER — Other Ambulatory Visit (INDEPENDENT_AMBULATORY_CARE_PROVIDER_SITE_OTHER): Payer: PRIVATE HEALTH INSURANCE

## 2021-08-21 DIAGNOSIS — R002 Palpitations: Secondary | ICD-10-CM | POA: Diagnosis not present

## 2021-08-26 ENCOUNTER — Other Ambulatory Visit: Payer: Self-pay | Admitting: Internal Medicine

## 2021-08-26 DIAGNOSIS — R002 Palpitations: Secondary | ICD-10-CM | POA: Diagnosis not present

## 2021-09-25 ENCOUNTER — Ambulatory Visit (INDEPENDENT_AMBULATORY_CARE_PROVIDER_SITE_OTHER)
Admission: RE | Admit: 2021-09-25 | Discharge: 2021-09-25 | Disposition: A | Discharge status: Home / Self Care | Payer: Self-pay | Source: Ambulatory Visit | Attending: Internal Medicine | Admitting: Internal Medicine

## 2021-09-25 DIAGNOSIS — R0789 Other chest pain: Secondary | ICD-10-CM

## 2022-06-11 IMAGING — CT CT CARDIAC CORONARY ARTERY CALCIUM SCORE
3 series · 14 of 20 positions shown, 16 images · non-contrast
Comparison: 10/27/2012
COMPARISON: 10/27/2012

Addendum:
EXAM:
OVER-READ INTERPRETATION  CT CHEST

The following report is an over-read performed by radiologist Dr.
Zeida Mickelsen [REDACTED] on 09/25/2021. This over-read
does not include interpretation of cardiac or coronary anatomy or
pathology. The calcium score interpretation by the cardiologist is
attached.
CLINICAL DATA: Cardiovascular Disease Risk stratification
Coronary Calcium Score
TECHNIQUE: A gated, non-contrast computed tomography scan of the heart was
performed using 3mm slice thickness. Axial images were analyzed on a
dedicated workstation. Calcium scoring of the coronary arteries was
performed using the Agatston method.

[Series 2: cascseq 2.0 sa36 70% (id) · axial · 0.46mm/px · z∈[-19,+71]mm · 4 of 76 slices shown]
[im 16/76  vessel]
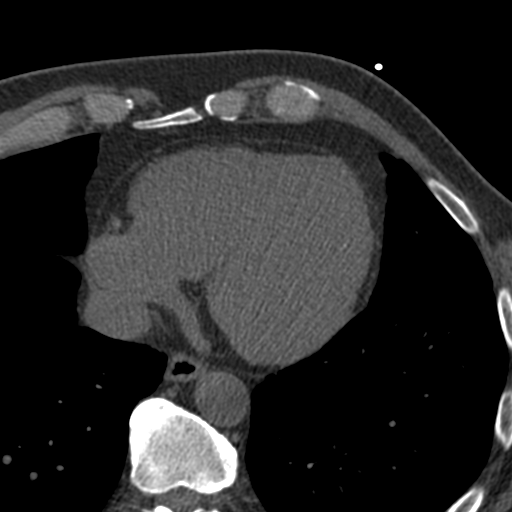
[im 31/76  vessel]
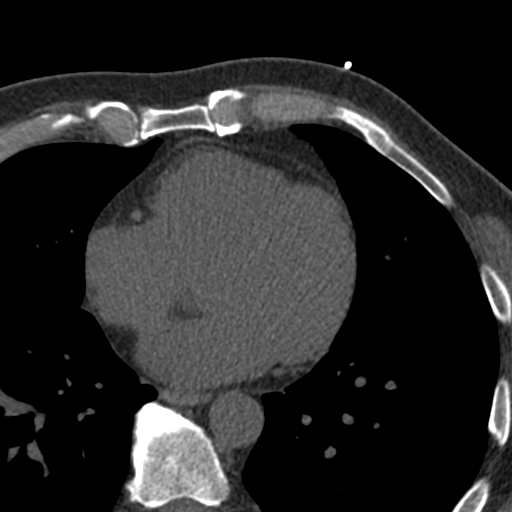
[im 46/76  vessel]
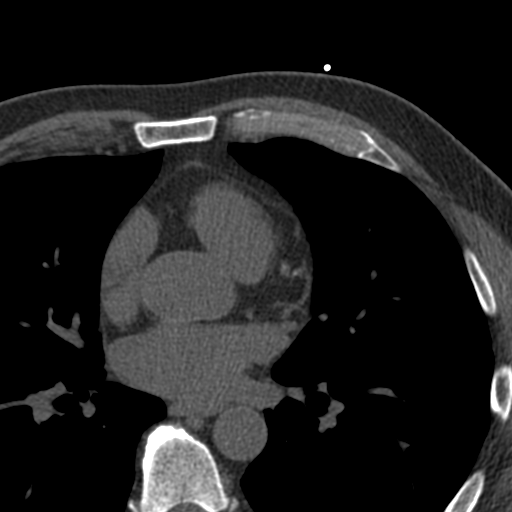
[im 61/76  vessel]
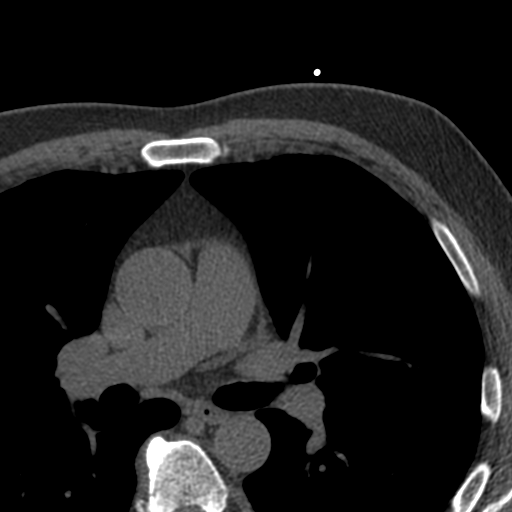

[Series 3: cascseq 2.0 bf37 st · axial · 0.73mm/px · z∈[-25,+75]mm · 5 of 76 slices shown, 7 images]
[im 13/76  vessel]
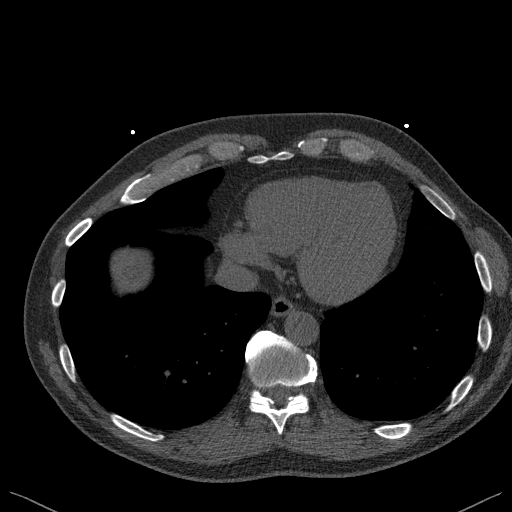
[im 13/76  lung]
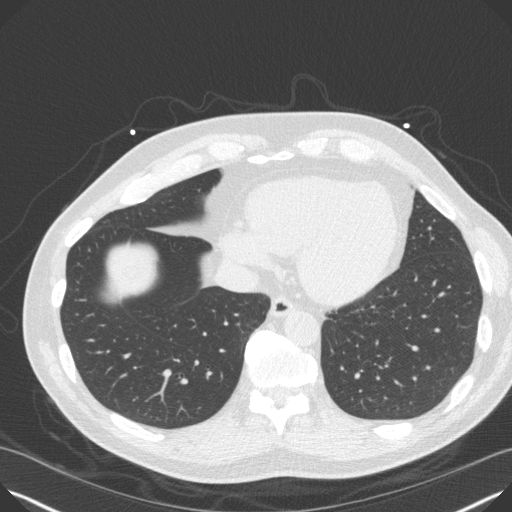
[im 26/76  vessel]
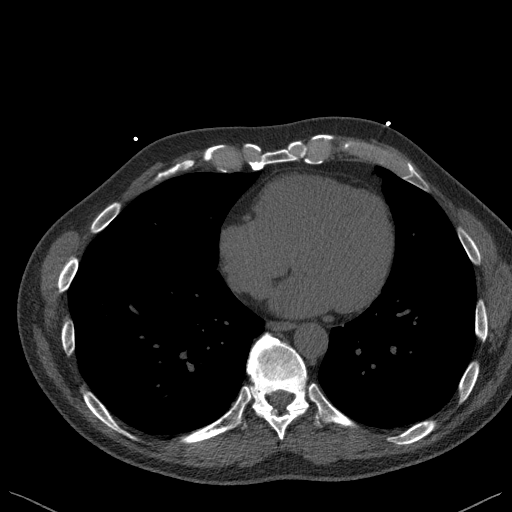
[im 38/76  vessel]
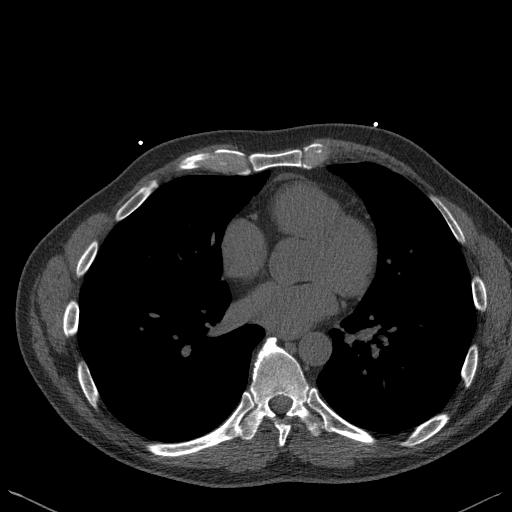
[im 51/76  vessel]
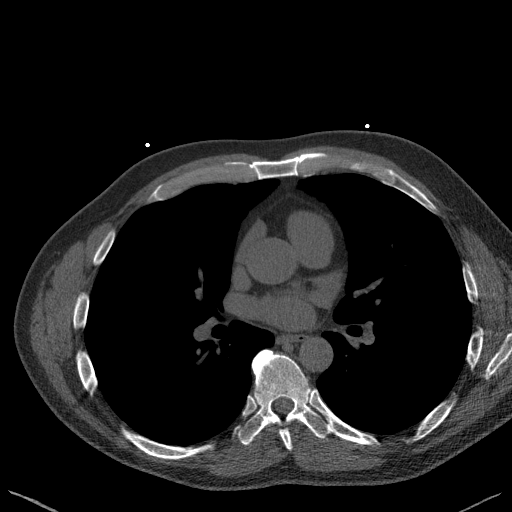
[im 63/76  vessel]
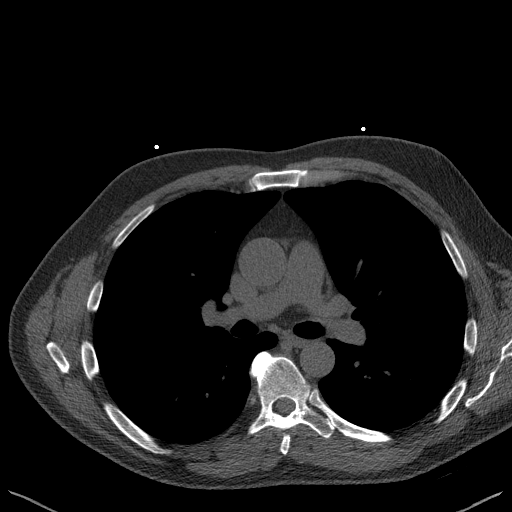
[im 63/76  lung]
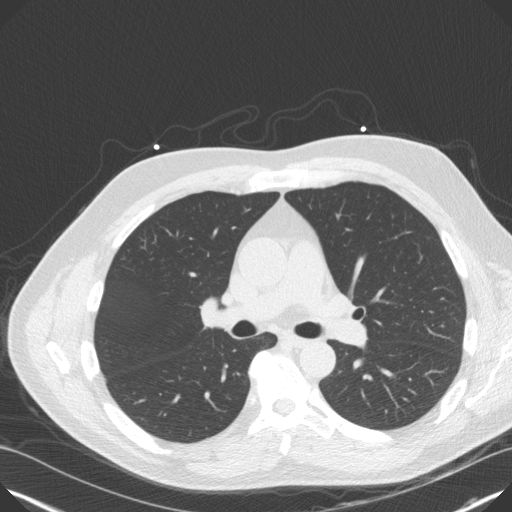

[Series 4: cascseq 2.0 br59 lung · axial · 0.73mm/px · z∈[-25,+75]mm · 5 of 76 slices shown]
[im 13/76  lung]
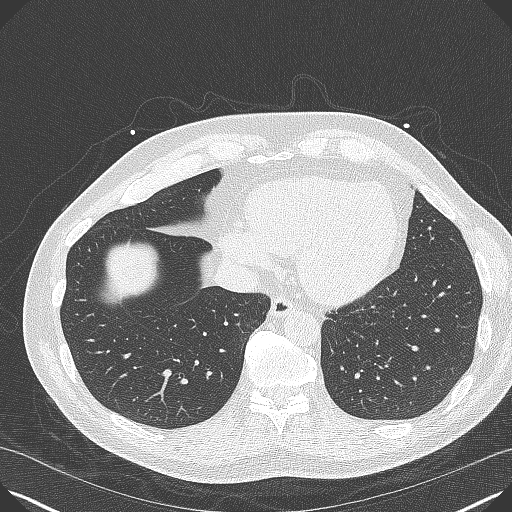
[im 26/76  lung]
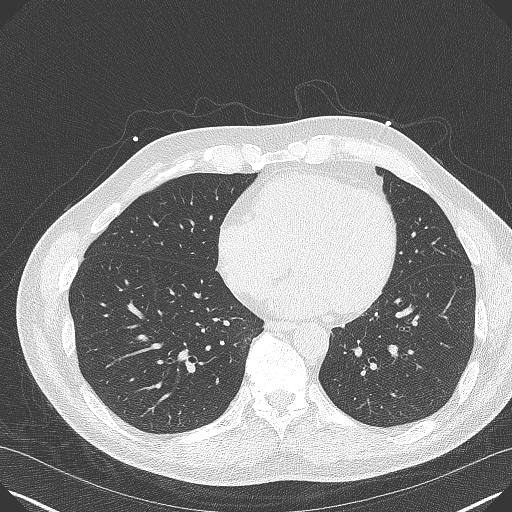
[im 38/76  lung]
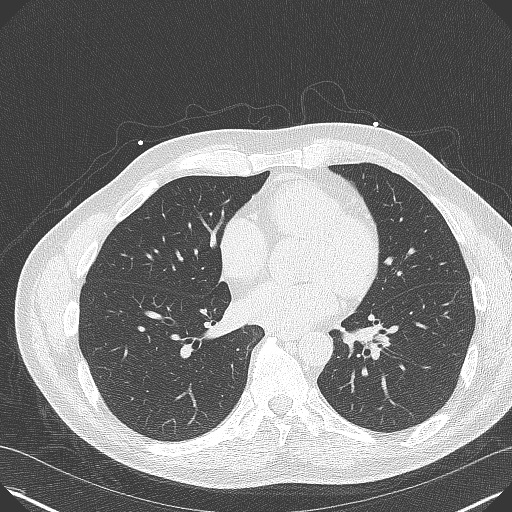
[im 51/76  lung]
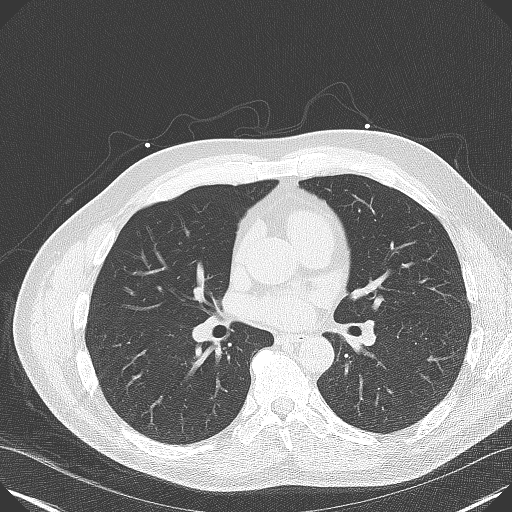
[im 63/76  lung]
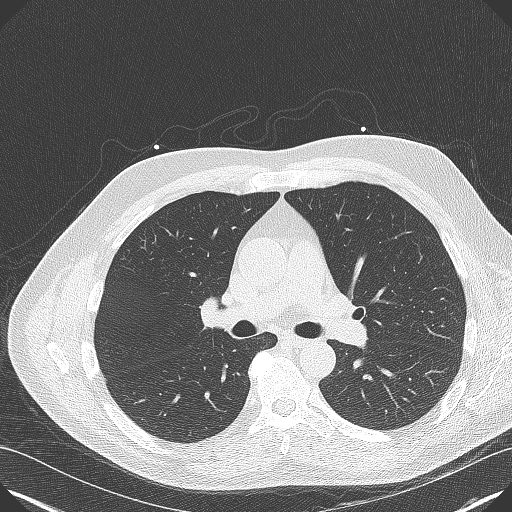

[14 of 20 positions shown; findings below may reference images not displayed]

FINDINGS: Vascular: Normal aortic caliber.

Mediastinum/Nodes: No imaged thoracic adenopathy.

Lungs/Pleura: No pleural fluid. 2 mm Perifissural right middle lobe
pulmonary nodule on [DATE].

Medial right lower lobe calcified granuloma on 43/4.

2 mm left lower lobe pulmonary nodule on [DATE].

Upper Abdomen: Suspect tiny hepatic dome cysts. Normal imaged
portions of the spleen, stomach.

Musculoskeletal: No acute osseous abnormality.
IMPRESSION: No acute findings in the imaged extracardiac chest.

Tiny bilateral pulmonary nodules. No follow-up needed if patient is
low-risk. Non-contrast chest CT can be considered in 12 months if
patient is high-risk. This recommendation follows the consensus
statement: Guidelines for Management of Incidental Pulmonary Nodules
Detected on CT Images: From the [HOSPITAL] 7505; Radiology
FINDINGS: Coronary Calcium Score:

Left main: 0

Left anterior descending artery:

Left circumflex artery: 0

Right coronary artery: 0

Total:

Percentile: 42nd

Pericardium: Normal.

Non-cardiac: See separate report from [REDACTED].
IMPRESSION: Coronary calcium score of 18.5. This was 42nd percentile for age-,
race-, and sex-matched controls.



If CAC=0, it is reasonable to withhold statin therapy and reassess
in 5 to 10 years, as long as higher risk conditions are absent
(diabetes mellitus, family history of premature CHD in first degree
relatives (males <55 years; females <65 years), cigarette smoking,
or LDL >=190 mg/dL).

If CAC is 1 to 99, it is reasonable to initiate statin therapy for
patients >=55 years of age.

If CAC is >=100 or >=75th percentile, it is reasonable to initiate
statin therapy at any age.

Cardiology referral should be considered for patients with CAC
scores >=400 or >=75th percentile.

*5662 AHA/ACC/AACVPR/AAPA/ABC/SEED/COTHRAN/LAZARO/Thomas/BISI/MINARDI/NYA
Guideline on the Management of Blood Cholesterol: A Report of the
American College of Cardiology/American Heart Association Task Force
on Clinical Practice Guidelines. J Am Coll Cardiol.
4579;73(24):2675-2106.

*** End of Addendum ***
EXAM:
OVER-READ INTERPRETATION  CT CHEST

The following report is an over-read performed by radiologist Dr.
Zeida Mickelsen [REDACTED] on 09/25/2021. This over-read
does not include interpretation of cardiac or coronary anatomy or
pathology. The calcium score interpretation by the cardiologist is
attached.
FINDINGS: Vascular: Normal aortic caliber.

Mediastinum/Nodes: No imaged thoracic adenopathy.

Lungs/Pleura: No pleural fluid. 2 mm Perifissural right middle lobe
pulmonary nodule on [DATE].

Medial right lower lobe calcified granuloma on 43/4.

2 mm left lower lobe pulmonary nodule on [DATE].

Upper Abdomen: Suspect tiny hepatic dome cysts. Normal imaged
portions of the spleen, stomach.

Musculoskeletal: No acute osseous abnormality.
IMPRESSION: No acute findings in the imaged extracardiac chest.

Tiny bilateral pulmonary nodules. No follow-up needed if patient is
low-risk. Non-contrast chest CT can be considered in 12 months if
patient is high-risk. This recommendation follows the consensus
statement: Guidelines for Management of Incidental Pulmonary Nodules
Detected on CT Images: From the [HOSPITAL] 7505; Radiology

## 2022-08-04 ENCOUNTER — Other Ambulatory Visit: Payer: Self-pay | Admitting: Internal Medicine

## 2022-09-27 ENCOUNTER — Telehealth: Payer: Self-pay

## 2022-09-27 DIAGNOSIS — I10 Essential (primary) hypertension: Secondary | ICD-10-CM

## 2022-09-27 MED ORDER — CARVEDILOL 6.25 MG PO TABS: 6.2500 mg | ORAL_TABLET | Freq: Two times a day (BID) | ORAL | 3 refills | Status: DC

## 2022-09-27 NOTE — Telephone Encounter (Signed)
Called per message received from Dr. Lewayne Bunting;  Per Dr. 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   Message x 3 left for home, work, and cell for Dr. Briscoe Deutscher.    I will make Dr. Briscoe Deutscher aware of Dr. Lubertha Basque orders as stated above, and verify his Pharmacy prior to submitting this order.  Follow up is required.

## 2022-09-27 NOTE — Addendum Note (Signed)
Addended by: Bennie Dallas C on: 09/27/2022 04:13 PM   Modules accepted: Orders

## 2022-09-27 NOTE — Telephone Encounter (Signed)
Dr. Briscoe Deutscher called back per message received at his office.    Dr. Briscoe Deutscher made aware that we were discontinuing his Toprol XL today;  and per Dr. Ladona Ridgel, Pt will be taking Coreg 6.25 mg BID, instead to better manage his hypertension.    Dr Briscoe Deutscher verified [facility] [place].  Dr Briscoe Deutscher verbalized understanding of these new orders and change in his plan of care.  Pt advised to call [facility] if he has new symptoms after starting the medication.  No follow up required at this time.

## 2022-09-30 MED ORDER — CARVEDILOL 6.25 MG PO TABS: 12.5000 mg | ORAL_TABLET | Freq: Two times a day (BID) | ORAL | 3 refills | Status: DC

## 2022-09-30 NOTE — Addendum Note (Signed)
Addended by: Bennie Dallas C on: 09/30/2022 02:10 PM   Modules accepted: Orders

## 2022-09-30 NOTE — Telephone Encounter (Signed)
Per Dr. Lewayne Bunting, increase Dr. Briscoe Deutscher Coreg dose to 12.5 mg, to be taken twice daily.   Dr. Briscoe Deutscher voicemail obtained, detailed voicemail message left. Dr. Briscoe Deutscher advised to call office if he has questions. Dr. Briscoe Deutscher advised do double the medication he already has twice daily.    Pt MAR updated, and will reach out if he needs refills.

## 2022-11-01 ENCOUNTER — Other Ambulatory Visit: Payer: Self-pay | Admitting: Internal Medicine

## 2022-11-04 ENCOUNTER — Telehealth: Payer: Self-pay | Admitting: *Deleted

## 2022-11-04 NOTE — Telephone Encounter (Signed)
Left message on machine for pt to contact the office.Pt needs refill on Coreg, received a message from [facility] [place] requesting pt is taking 4 tablets of Coreg but no dose or mg attached. Pt needs in office appt scheduled . Pt has not been seen in over a year.

## 2022-11-05 ENCOUNTER — Other Ambulatory Visit: Payer: Self-pay

## 2022-11-05 DIAGNOSIS — I10 Essential (primary) hypertension: Secondary | ICD-10-CM

## 2022-11-05 DIAGNOSIS — R002 Palpitations: Secondary | ICD-10-CM

## 2022-11-09 ENCOUNTER — Telehealth: Payer: Self-pay | Admitting: Internal Medicine

## 2022-11-09 DIAGNOSIS — I10 Essential (primary) hypertension: Secondary | ICD-10-CM

## 2022-11-09 MED ORDER — CARVEDILOL 6.25 MG PO TABS: 12.5000 mg | ORAL_TABLET | Freq: Two times a day (BID) | ORAL | 0 refills | Status: DC

## 2022-11-09 NOTE — Telephone Encounter (Signed)
Pt's medication was sent to pt's pharmacy as requested. Confirmation received.  °

## 2022-11-09 NOTE — Telephone Encounter (Signed)
 *  STAT* If patient is at the pharmacy, call can be transferred to refill team.   1. Which medications need to be refilled? (please list name of each medication and dose if known)   carvedilol (COREG) 6.25 MG tablet    2. Which pharmacy/location (including street and city if local pharmacy) is medication to be sent to? [id number] [place], [place] - [address]    3. Do they need a 30 day or 90 day supply? 1 week supply (42 pills)  Pt only ave 1 pill left and he needs enough meds until he sees Dr. Ladona Ridgel on 05/23

## 2022-11-10 MED ORDER — CARVEDILOL 6.25 MG PO TABS: 6.2500 mg | ORAL_TABLET | Freq: Three times a day (TID) | ORAL | 0 refills | Status: DC

## 2022-11-10 NOTE — Telephone Encounter (Signed)
Pt called back to [facility] for refill of Coreg 12.5 mg tablet BID?  Pt contacted to verify pharmacy.  Follow up required.

## 2022-11-10 NOTE — Telephone Encounter (Signed)
Pt contacted per Carvedilol refill request received in [facility] T[facility].    Pt stated that Dr. Lewayne Bunting increased his Coreg to 6.25 mg, by mouth, to be taken 3 times daily; For BP control.  Pt stated that he has an appointment scheduled with Dr. Ladona Ridgel on Thursday, 5/23 at 430 pm at [facility].  Pt is provider and stated that Dr. Ladona Ridgel will most likely revise this med order, after tomorrow's assessment.   Pt advised that I will update his MAR,and we will refill his script tomorrow, after Dr. Lubertha Basque assessment and POC.  Pt agreed, no follow up required at this time.

## 2022-11-10 NOTE — Addendum Note (Signed)
Addended by: Bennie Dallas C on: 11/10/2022 02:36 PM   Modules accepted: Orders

## 2022-11-11 ENCOUNTER — Encounter: Payer: Self-pay | Admitting: Internal Medicine

## 2022-11-11 ENCOUNTER — Ambulatory Visit: Payer: PRIVATE HEALTH INSURANCE | Attending: Internal Medicine | Admitting: Internal Medicine

## 2022-11-11 VITALS — BP 148/82 | HR 73 | Ht 74.0 in | Wt 232.0 lb

## 2022-11-11 DIAGNOSIS — I10 Essential (primary) hypertension: Secondary | ICD-10-CM

## 2022-11-11 DIAGNOSIS — E785 Hyperlipidemia, unspecified: Secondary | ICD-10-CM

## 2022-11-11 DIAGNOSIS — R002 Palpitations: Secondary | ICD-10-CM

## 2022-11-11 MED ORDER — CARVEDILOL 25 MG PO TABS: 25.0000 mg | ORAL_TABLET | Freq: Two times a day (BID) | ORAL | 3 refills | Status: DC

## 2022-11-11 NOTE — Progress Notes (Signed)
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 He has a h/o HTN and palpitations and has not had any evidence of sustained atrial fib. He denies chest pain and works out almost daily, once a week with a Psychologist, educational. He has had rare dizzy episodes. No syncope. No edema. He describes an episode of chest pressure when he was visiting his mother down in [place]. It resolved after several hours and traveled into the abdominal region. He has subsequently exercised vigorously on multiple occaisions without any symptoms. Previously he had a calcium score of zero about 8 years ago.   Since I saw him last, he has had problems with his bp. He was switched to coreg from toprol. He has been elevated. He notes that he has been under worsening stress.    Current Outpatient Medications  Medication Sig Dispense Refill   aspirin 81 MG tablet Take 81 mg by mouth daily.     carvedilol (COREG) 25 MG tablet Take 1 tablet (25 mg total) by mouth 2 (two) times daily. Take with food or after meal. 180 tablet 3   finasteride (PROPECIA) 1 MG tablet Take 1 mg by mouth daily.     No current facility-administered medications for this visit.     Past Medical History:  Diagnosis Date   Dyslipidemia    Hypertension    Paroxysmal atrial fibrillation (HCC)     ROS:   All systems reviewed and negative except as noted in the HPI.   History reviewed. No pertinent surgical history.   Family History  Problem Relation Age of Onset   Hypertension Mother    Other Mother        IgG deficiency   Crohn's disease Father    Lung cancer Father        Cause of death   Cancer Father        prostate   Hypertension Sister      Social History   Socioeconomic History   Marital status: Married    Spouse name: Not on file   Number of children: Not on file   Years of education: Not on file   Highest education level: Not on file  Occupational History   Not on file  Tobacco Use   Smoking status: Never    Smokeless tobacco: Never  Vaping Use   Vaping Use: Never used  Substance and Sexual Activity   Alcohol use: Yes    Comment: Rare drinker   Drug use: No   Sexual activity: Not on file  Other Topics Concern   Not on file  Social History Narrative   Not on file   Social Determinants of Health   Financial Resource Strain: Not on file  Food Insecurity: Not on file  Transportation Needs: Not on file  Physical Activity: Not on file  Stress: Not on file  Social Connections: Not on file  Intimate Partner Violence: Not on file     BP (!) 148/82   Pulse 73   Ht 6\' 2"  (1.88 m)   Wt 232 lb (105.2 kg)   SpO2 95%   BMI 29.79 kg/m   Physical Exam:  Well appearing NAD HEENT: Unremarkable Neck:  No JVD, no thyromegally Lymphatics:  No adenopathy Back:  No CVA tenderness Lungs:  Clear with no wheezes HEART:  Regular rate rhythm, no murmurs, no rubs, no clicks Abd:  soft, positive bowel sounds, no organomegally, no rebound, no guarding Ext:  2 plus pulses, no  edema, no cyanosis, no clubbing Skin:  No rashes no nodules Neuro:  CN II through XII intact, motor grossly intact  EKG- nsr  DEVICE  Normal device function.  See PaceArt for details.   Assess/Plan: 1. HTN - his bp is well controlled. He is encouraged to lose weight. I asked him to increase the coreg to 25 bid. Additional bp med changes to follow. 2. Palps - these have recently increased and I have asked him to where a Zio monitor. 3. Dyslipidemia - his fasting labs were done a couple of weeks ago at his primary MD's and look ok. His LDL was elevated. 4. Chest pain - 8 years ago he had a coronary calcium score of 0. His current symptoms are non-cardiac.    Colin Gowda Samanthajo Payano,MD

## 2022-11-11 NOTE — Patient Instructions (Addendum)
Medication Instructions:  Your physician has recommended you make the following change in your medication: START Taking: Carvedilol 25 mg, BID.  STOP Taking: Carvedilol 6.25 mg, TID, Today, 11/11/2022.   Carvedilol 25 mg You will-  Take 1 tablet (25 mg total) by mouth 2 (two) times daily. Take with food or after meal   Prescription sent into [facility], as entered in Epic.    Lab Work: None ordered.  If you have labs (blood work) drawn today and your tests are completely normal, you will receive your results only by: MyChart Message (if you have MyChart) OR A paper copy in the mail If you have any lab test that is abnormal or we need to change your treatment, we will call you to review the results.  Testing/Procedures: None ordered.  Follow-Up: Per Dr. Lewayne Bunting, patient will follow up as needed.    The format for your next appointment:   In Person  Provider:   Lewayne Bunting, MD{or one of the following Advanced Practice Providers on your designated Care Team:   Francis Dowse, [place] Casimiro Needle "Mardelle Matte" [place], [place]   Carvedilol Tablets What is this medication? CARVEDILOL (KAR ve dil ol) treats high blood pressure and heart failure. It may also be used to prevent further damage after a heart attack. It works by lowering your blood pressure and heart rate, making it easier for your heart to pump blood to the rest of your body. It belongs to a group of medications called beta blockers. This medicine may be used for other purposes; ask your health care provider or pharmacist if you have questions. COMMON BRAND NAME(S): Coreg What should I tell my care team before I take this medication? They need to know if you have any of these conditions: Circulation problems Diabetes History of heart attack or heart disease Liver disease Lung or breathing disease, such as asthma Pheochromocytoma Slow or irregular heartbeat Thyroid disease An unusual or allergic reaction to carvedilol,  other medications, foods, dyes, or preservatives Pregnant or trying to get pregnant Breastfeeding How should I use this medication? Take this medication by mouth. Take it as directed on the prescription label at the same time every day. Take it with food. Keep taking it unless your care team tells you to stop. Talk to your care team about the use of this medication in children. Special care may be needed. Overdosage: If you think you have taken too much of this medicine contact a poison control center or emergency room at once. NOTE: This medicine is only for you. Do not share this medicine with others. What if I miss a dose? If you miss a dose, take it as soon as you can. If it is almost time for your next dose, take only that dose. Do not take double or extra doses. What may interact with this medication? This medication may interact with the following: Certain medications for blood pressure, heart disease, irregular heartbeat Certain medications for depression, such as fluoxetine or paroxetine Certain medications for diabetes, such as glipizide or glyburide Cimetidine Clonidine Cyclosporine Digoxin MAOIs, such as Carbex, Eldepryl, Marplan, Nardil, and Parnate Reserpine Rifampin This list may not describe all possible interactions. Give your health care provider a list of all the medicines, herbs, non-prescription drugs, or dietary supplements you use. Also tell them if you smoke, drink alcohol, or use illegal drugs. Some items may interact with your medicine. What should I watch for while using this medication? Visit your care team for regular checks  on your progress. Check your blood pressure as directed. Know what your blood pressure should be and when to contact your care team. This medication may affect your coordination, reaction time, or judgment. Do not drive or operate machinery until you know how this medication affects you. Sit up or stand slowly to reduce the risk of dizzy or  fainting spells. Drinking alcohol with this medication can increase the risk of these side effects. Do not suddenly stop taking this medication. This may increase your risk of side effects, such as chest pain and heart attack. If you no longer need to take this medication, your care team will lower the dose slowly over time to decrease the risk of side effects. If you are going to need surgery or a procedure, tell your care team that you are using this medication. This medication may affect blood glucose levels. It can also mask the symptoms of low blood sugar, such as a rapid heartbeat and tremors. If you have diabetes, it is important to check your blood sugar often while you are taking this medication. Do not treat yourself for coughs, colds, or pain while you are using this medication without asking your care team for advice. Some medications may increase your blood pressure. What side effects may I notice from receiving this medication? Side effects that you should report to your care team as soon as possible: Allergic reactions--skin rash, itching, hives, swelling of the face, lips, tongue, or throat Heart failure--shortness of breath, swelling of the ankles, feet, or hands, sudden weight gain, unusual weakness or fatigue Low blood pressure--dizziness, feeling faint or lightheaded, blurry vision Raynaud's--cool, numb, or painful fingers or toes that may change color from pale, to blue, to red Slow heartbeat--dizziness, feeling faint or lightheaded, confusion, trouble breathing, unusual weakness or fatigue Worsening mood, feelings of depression Side effects that usually do not require medical attention (report to your care team if they continue or are bothersome): Change in sex drive or performance Diarrhea Dizziness Fatigue Headache This list may not describe all possible side effects. Call your doctor for medical advice about side effects. You may report side effects to FDA at  1-800-FDA-1088. Where should I keep my medication? Keep out of the reach of children and pets. Store at room temperature between 20 and 25 degrees C (68 and 77 degrees F). Protect from moisture. Keep the container tightly closed. Throw away any unused medication after the expiration date. NOTE: This sheet is a summary. It may not cover all possible information. If you have questions about this medicine, talk to your doctor, pharmacist, or health care provider.  2024 Elsevier/Gold Standard (2022-06-07 00:00:00)

## 2023-04-19 ENCOUNTER — Other Ambulatory Visit: Payer: Self-pay

## 2023-04-19 MED ORDER — LOSARTAN POTASSIUM 25 MG PO TABS: 25.0000 mg | ORAL_TABLET | Freq: Every day | ORAL | 3 refills | Status: DC

## 2023-06-08 NOTE — Therapy (Unsigned)
OUTPATIENT PHYSICAL THERAPY SHOULDER EVALUATION   Patient Name: Colin Stanley MRN: [id number] DOB:03-Feb-1961, 62 y.o., male Today's Date: 06/08/2023  END OF SESSION:   Past Medical History:  Diagnosis Date   Dyslipidemia    Hypertension    Paroxysmal atrial fibrillation (HCC)    No past surgical history on file. Patient Active Problem List   Diagnosis Date Noted   Palpitations 06/01/2019   Dyslipidemia 03/05/2009   Essential hypertension 03/05/2009   ATRIAL FIBRILLATION, PAROXYSMAL 03/05/2009    PCP: Andi Devon, MD  REFERRING PROVIDER: Andi Devon, MD  REFERRING DIAG: M25.511 (ICD-10-CM) - Pain in right shoulder  THERAPY DIAG:  No diagnosis found.  Rationale for Evaluation and Treatment: Rehabilitation  ONSET DATE: ***  SUBJECTIVE:                                                                                                                                                                                      SUBJECTIVE STATEMENT: *** Hand dominance: {MISC; OT HAND [phone]}  PERTINENT HISTORY: ***  PAIN:  Are you having pain? {OPRCPAIN:27236}  PRECAUTIONS: None  RED FLAGS: None   WEIGHT BEARING RESTRICTIONS: No  FALLS:  Has patient fallen in last 6 months? No  OCCUPATION: ***  PLOF: Independent  PATIENT GOALS:***  NEXT MD VISIT:   OBJECTIVE:  Note: Objective measures were completed at Evaluation unless otherwise noted.  DIAGNOSTIC FINDINGS:  none  PATIENT SURVEYS:  FOTO ***  POSTURE: ***  UPPER EXTREMITY ROM:   {AROM/PROM:27142} ROM Right eval Left eval  Shoulder flexion    Shoulder extension    Shoulder abduction    Shoulder adduction    Shoulder internal rotation    Shoulder external rotation    Elbow flexion    Elbow extension    Wrist flexion    Wrist extension    Wrist ulnar deviation    Wrist radial deviation    Wrist pronation    Wrist supination    (Blank rows = not  tested)  UPPER EXTREMITY MMT:  MMT Right eval Left eval  Shoulder flexion    Shoulder extension    Shoulder abduction    Shoulder adduction    Shoulder internal rotation    Shoulder external rotation    Middle trapezius    Lower trapezius    Elbow flexion    Elbow extension    Wrist flexion    Wrist extension    Wrist ulnar deviation    Wrist radial deviation    Wrist pronation    Wrist supination    Grip strength (lbs)    (Blank rows = not tested)  SHOULDER SPECIAL TESTS: Impingement tests: {shoulder  impingement test:25231:a} SLAP lesions: {SLAP lesions:25232} Instability tests: {shoulder instability test:25233} Rotator cuff assessment: {rotator cuff assessment:25234} Biceps assessment: {biceps assessment:25235}  JOINT MOBILITY TESTING:  ***  PALPATION:  ***   TODAY'S TREATMENT:                                                                                                                                         DATE: ***   PATIENT EDUCATION: Education details: Discussed eval findings, rehab rationale and POC and patient is in agreement  Person educated: Patient Education method: Explanation Education comprehension: verbalized understanding and needs further education  HOME EXERCISE PROGRAM: ***  ASSESSMENT:  CLINICAL IMPRESSION: Patient is a 62 y.o. male who was seen today for physical therapy evaluation and treatment for ***.   OBJECTIVE IMPAIRMENTS: {opptimpairments:25111}.   ACTIVITY LIMITATIONS: {activitylimitations:27494}  PARTICIPATION LIMITATIONS: {participationrestrictions:25113}  PERSONAL FACTORS: {Personal factors:25162} are also affecting patient's functional outcome.   REHAB POTENTIAL: Good  CLINICAL DECISION MAKING: Stable/uncomplicated  EVALUATION COMPLEXITY: Low   GOALS: Goals reviewed with patient? No  SHORT TERM GOALS: Target date: ***  Patient to demonstrate independence in HEP  Baseline: Goal status: INITIAL  2.   *** Baseline:  Goal status: INITIAL  3.  *** Baseline:  Goal status: INITIAL  4.  *** Baseline:  Goal status: INITIAL  5.  *** Baseline:  Goal status: INITIAL  6.  *** Baseline:  Goal status: INITIAL  LONG TERM GOALS: Target date: ***  Patient will score at least ***% on FOTO to signify clinically meaningful improvement in functional abilities.   Baseline:  Goal status: INITIAL  2.  Patient will acknowledge ***/10 pain at least once during episode of care   Baseline:  Goal status: INITIAL  3.  *** Baseline:  Goal status: INITIAL  4.  *** Baseline:  Goal status: INITIAL  5.  *** Baseline:  Goal status: INITIAL  6.  *** Baseline:  Goal status: INITIAL  PLAN:  PT FREQUENCY: 1-2x/week  PT DURATION: 8 weeks  PLANNED INTERVENTIONS: 97164- PT Re-evaluation, 97110-Therapeutic exercises, 97530- Therapeutic activity, 97112- Neuromuscular re-education, 97535- Self Care, 44010- Manual therapy, 97014- Electrical stimulation (unattended), Patient/Family education, Dry Needling, Joint mobilization, Spinal mobilization, Cryotherapy, and Moist heat  PLAN FOR NEXT SESSION: HEP review and update, manual techniques as appropriate, aerobic tasks, ROM and flexibility activities, strengthening and PREs, TPDN, gait and balance training as needed     Hildred Laser, PT 06/08/2023, 12:56 PM

## 2023-06-09 ENCOUNTER — Ambulatory Visit: Payer: PRIVATE HEALTH INSURANCE

## 2023-06-09 ENCOUNTER — Ambulatory Visit: Payer: PRIVATE HEALTH INSURANCE | Admitting: Physical Therapy

## 2023-06-09 NOTE — Therapy (Signed)
OUTPATIENT PHYSICAL THERAPY CERVICAL EVALUATION   Patient Name: Colin Stanley MRN: [id number] DOB:09-06-1960, 62 y.o., male Today's Date: 06/16/2023  END OF SESSION:  PT End of Session - 06/16/23 1320     Visit Number 1    Number of Visits 12    Date for PT Re-Evaluation 08/25/23    Authorization Type [employer]    PT Start Time 1330    PT Stop Time 1415    PT Time Calculation (min) 45 min    Activity Tolerance Patient tolerated treatment well    Behavior During Therapy WFL for tasks assessed/performed             Past Medical History:  Diagnosis Date   Dyslipidemia    Hypertension    Paroxysmal atrial fibrillation (HCC)    No past surgical history on file. Patient Active Problem List   Diagnosis Date Noted   Palpitations 06/01/2019   Dyslipidemia 03/05/2009   Essential hypertension 03/05/2009   ATRIAL FIBRILLATION, PAROXYSMAL 03/05/2009    PCP: Andi Devon, MD   REFERRING PROVIDER: Andi Devon, MD   REFERRING DIAG: M25.511 (ICD-10-CM) - Pain in right shoulder   THERAPY DIAG:  Cervicalgia  Pain in thoracic spine  Abnormal posture  Muscle weakness (generalized)  Rationale for Evaluation and Treatment: Rehabilitation  ONSET DATE: about 20 years and job related  SUBJECTIVE:                                                                                                                                                                                                         SUBJECTIVE STATEMENT: I have had tightness and pain in neck and upper back for years. 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  I also play golf.  But I have a lot of muscle knots in back for years.   I have headache once a week regularly. I work full time and I have pain after a week. I feel tight when turning my neck to drive and to see some of my patients in dental chair. I really feel tight all the time and it has been for years   Hand  dominance: Right  PERTINENT HISTORY:  ACL surgery . 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 Yes: NPRS scale: tightness now. 6/10 pain  10/10 when shoulder hits doorway painful Pain location: neck/ rhomboids and upper back  Pain description: shooting pain up my spine when I hit bil shoulder (like  running in to door Aggravating factors: after a day of dentistry mostly Thursday afternoons after a week headaches once a week in neck region  Relieving factors: advil  PRECAUTIONS: None  RED FLAGS: None     WEIGHT BEARING RESTRICTIONS: No  FALLS:  Has patient fallen in last 6 months? No  LIVING ENVIRONMENT: Lives with: lives with their spouse Lives in: House/apartment Stairs: no issue with stairs.  Has following equipment at home: None  OCCUPATION: Dentist  PLOF: Independent  PATIENT GOALS: to be more flexible reduced  NEXT MD VISIT: TBD  OBJECTIVE:  Note: Objective measures were completed at Evaluation unless otherwise noted.  DIAGNOSTIC FINDINGS:  None recent but by pt report  bulging disc at C 4/5 , 5/6 and 6/7  PATIENT SURVEYS:  FOTO 83%  predicited  77%  adusted to 87%  COGNITION: Overall cognitive status: Within functional limits for tasks assessed  SENSATION: Sometime transient paresthesia from  the bulging disc at night  POSTURE: rounded shoulders, forward head, and flexed trunk   PALPATION: Pt with moderate to severe tightness over bil UT/LS/ upper back/ rhomboids and cervical paraspinals   CERVICAL ROM:   Active ROM A/PROM (deg) eval  Flexion 35  Extension 45  Right lateral flexion 20  Left lateral flexion 22  Right rotation 47  Left rotation 50   (Blank rows = not tested)  UPPER EXTREMITY ROM:  Active ROM Right eval Left eval  Shoulder flexion 140 148  Shoulder extension    Shoulder abduction 140 155  Shoulder adduction    Shoulder extension    Shoulder internal rotation 80 90  Shoulder  external rotation    Elbow flexion    Elbow extension    Wrist flexion    Wrist extension    Wrist ulnar deviation    Wrist radial deviation    Wrist pronation    Wrist supination     (Blank rows = not tested)  UPPER EXTREMITY MMT: grossly  4+/5 and overall muscular tightness  MMT Right eval Left eval  Shoulder flexion    Shoulder extension    Shoulder abduction    Shoulder adduction    Shoulder extension    Shoulder internal rotation    Shoulder external rotation    Middle trapezius    Lower trapezius    Elbow flexion    Elbow extension    Wrist flexion    Wrist extension    Wrist ulnar deviation    Wrist radial deviation    Wrist pronation    Wrist supination    Grip strength 110lb  115   (Blank rows = not tested)  CERVICAL SPECIAL TESTS:  Neck flexor muscle endurance test: Positive, Spurling's test: Negative, and Distraction test: Negative  FUNCTIONAL TESTS:  5 times sit to stand: 10.22 sec DNF test   9.86 sec TREATMENT DATE: 06-16-23  Eval and issue HEP  Trigger Point Dry-Needling  Treatment instructions: Expect mild to moderate muscle soreness. S/S of pneumothorax if dry needled over a lung field, and to seek immediate medical attention should they occur. Patient verbalized understanding of these instructions and education.  Patient Consent Given: Yes Education handout provided: Yes Muscles treated: bil UT/LS, L and R rhomboids. T- 4 to T-8 Right, Right subscapularis Electrical stimulation performed: No Parameters: N/A Treatment response/outcome: multiple Trigger point twitches and decreased muscle tension.  PATIENT EDUCATION:  Education details: POC Explanation of findings, issue HEP, TPDN education Person educated: Patient Education method: Explanation, Demonstration, Tactile cues, Verbal cues, and Handouts Education  comprehension: verbalized understanding, returned demonstration, verbal cues required, tactile cues required, and needs further education  HOME EXERCISE PROGRAM: Access Code: [email or web address] URL: [email or web address] Date: 06/16/2023 Prepared by: Garen Lah  Exercises - Shoulder External Rotation and Scapular Retraction with Resistance  - 2 x daily - 7 x weekly - 3 sets - 10 reps - Shoulder Extension with Resistance - Palms Forward  - 1 x daily - 7 x weekly - 3 sets - 10 reps - Seated Scapular Retraction  - 1 x daily - 7 x weekly - 3 sets - 10 reps - Supine Cervical Retraction with Towel  - 1 x daily - 7 x weekly - 1 sets - 10 reps - Seated Levator Scapulae Stretch  - 1 x daily - 7 x weekly - 1 sets - 3 reps - 30 hold - Seated Cervical Sidebending Stretch  - 1 x daily - 7 x weekly - 1 sets - 3 reps - 30 hold - Doorway Pec Stretch at 90 Degrees Abduction  - 1 x daily - 7 x weekly - 3 sets - 10 reps - Sidelying Open Book Thoracic Lumbar Rotation and Extension  - 1 x daily - 7 x weekly - 2 sets - 10 reps  ASSESSMENT:  CLINICAL IMPRESSION: Patient is a 62 y.o. male who was seen today for physical therapy evaluation and treatment for cervicalgia and thoracic neck pain and weekly headaches due to muscular tightness, abnormal posture, muscle weakness and decreased AROM. 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  He also is noticing rotational tightness with golfing and leisure activities.  Pt will benefit from skilled PT to return to decreased headaches and more comfortable motion in daily activities and leisure activities..   OBJECTIVE IMPAIRMENTS: decreased activity tolerance, decreased ROM, decreased strength, increased fascial restrictions, impaired flexibility, improper body mechanics, postural dysfunction, and pain.   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   REHAB POTENTIAL: Good  CLINICAL DECISION MAKING: Stable/uncomplicated  EVALUATION COMPLEXITY: Low   GOALS: Goals reviewed with patient? Yes  SHORT TERM GOALS: Target date: 07-20-22  Pt will be independent with initial HEP Baseline:  Goal status: INITIAL  2.  Patient will report neck/shoulder pain level </= 5/10 in order to reduce functional limitations and improve activity tolerance.  Baseline: Pt with pain in shoulders up to 10/10 when shoulder makes contact with wall,   also neck pain up to 6/10 with decreased movement and work tolerance Goal status: INITIAL  3.  Demonstrate understanding of proper sitting posture, body mechanics, work ergonomics, and be more conscious of position and posture throughout the day.  Baseline: Pt with forward head and work positions with poor body mechanics Goal status: INITIAL    LONG TERM GOALS: Target date: 08-25-22  Patient will be I with advanced HEP to maintain progress from PT.  Baseline: no knowledge Goal status: INITIAL  2.  Pt will experience 50% greater ease leisure activities such as golfing Baseline Pt with pain while golfing  Goal status: INITIAL  3.  Pt with reduction of headaches 2 x a month Baseline: Pt with weekly  HA Goal status: INITIAL  4.   FOTO will improve from  83%  to  87%   indicating improved functional mobility.  Baseline: 83% Goal status: INITIAL  5.  Improved cervical rotation to allow checking blind spots while driving with minimal discomfort Baseline: See AROM chart Goal status: INITIAL  6.  Pt will  improve shoulder AROM and MMT in order to use weight overhead with proper form Baseline: Not using weights routinely at present Goal status: INITIAL  7. Pt will be able to perform DNF supine test for at least 35 seconds to show improved neck  strength  Baseline: 9.86 sec  Goal Status INITIAL   PLAN:  PT FREQUENCY: 1x/week  PT DURATION: 10 weeks  PLANNED INTERVENTIONS: 97164- PT Re-evaluation, 97110-Therapeutic exercises, 97530- Therapeutic activity, 97112- Neuromuscular re-education, 97535- Self Care, 16109- Manual therapy, 97014- Electrical stimulation (unattended), [id number]- Electrical stimulation (manual), Patient/Family education, Taping, Dry Needling, Joint mobilization, Spinal mobilization, Cryotherapy, and Moist heat  PLAN FOR NEXT SESSION: TPDN, progress neck and upper back and shoulder HEP as needed Manual   Garen Lah, PT, ATRIC Certified Exercise Expert for the Aging Adult  06/16/23 4:21 PM Phone: [phone] Fax: [phone]

## 2023-06-16 ENCOUNTER — Ambulatory Visit: Payer: PRIVATE HEALTH INSURANCE | Attending: Internal Medicine | Admitting: Physical Therapy

## 2023-06-16 ENCOUNTER — Other Ambulatory Visit: Payer: Self-pay

## 2023-06-16 DIAGNOSIS — R293 Abnormal posture: Secondary | ICD-10-CM | POA: Insufficient documentation

## 2023-06-16 DIAGNOSIS — M6281 Muscle weakness (generalized): Secondary | ICD-10-CM | POA: Insufficient documentation

## 2023-06-16 DIAGNOSIS — M546 Pain in thoracic spine: Secondary | ICD-10-CM | POA: Insufficient documentation

## 2023-06-16 DIAGNOSIS — M542 Cervicalgia: Secondary | ICD-10-CM | POA: Diagnosis present

## 2023-06-16 NOTE — Patient Instructions (Signed)

## 2023-07-14 ENCOUNTER — Ambulatory Visit: Payer: Self-pay | Attending: Internal Medicine | Admitting: Physical Therapy

## 2023-07-14 DIAGNOSIS — M546 Pain in thoracic spine: Secondary | ICD-10-CM | POA: Insufficient documentation

## 2023-07-14 DIAGNOSIS — M542 Cervicalgia: Secondary | ICD-10-CM | POA: Insufficient documentation

## 2023-07-14 DIAGNOSIS — R293 Abnormal posture: Secondary | ICD-10-CM | POA: Insufficient documentation

## 2023-07-14 DIAGNOSIS — M6281 Muscle weakness (generalized): Secondary | ICD-10-CM | POA: Insufficient documentation

## 2023-07-14 NOTE — Therapy (Addendum)
OUTPATIENT PHYSICAL THERAPY CERVICAL TREATMENT   Patient Name: Colin Stanley MRN: [id number] DOB:[birth year], 63 y.o., male Today's Date: 07/14/2023  END OF SESSION:  PT End of Session - 07/14/23 1144     Visit Number 2    Number of Visits 12    Date for PT Re-Evaluation 08/25/23    Authorization Type [employer]    PT Start Time 1145    PT Stop Time 1230    PT Time Calculation (min) 45 min    Activity Tolerance Patient tolerated treatment well    Behavior During Therapy WFL for tasks assessed/performed              Past Medical History:  Diagnosis Date   Dyslipidemia    Hypertension    Paroxysmal atrial fibrillation (HCC)    No past surgical history on file. Patient Active Problem List   Diagnosis Date Noted   Palpitations 06/01/2019   Dyslipidemia 03/05/2009   Essential hypertension 03/05/2009   ATRIAL FIBRILLATION, PAROXYSMAL 03/05/2009    PCP: Andi Devon, MD   REFERRING PROVIDER: Andi Devon, MD   REFERRING DIAG: M25.511 (ICD-10-CM) - Pain in right shoulder   THERAPY DIAG:  Cervicalgia  Pain in thoracic spine  Abnormal posture  Muscle weakness (generalized)  Rationale for Evaluation and Treatment: Rehabilitation  ONSET DATE: about 20 years and job related  SUBJECTIVE:                                                                                                                                                                                                         SUBJECTIVE STATEMENT: I have noticed a difference by doing the exercises and after TPDN.  I sold my practice and my blood pressure has come down.  I was able to play golf with greater ease this week.   EVAL- I have had tightness and pain in neck and upper back for years. 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  I also play golf.  But I have a lot of muscle knots in back for years.   I have headache once a week regularly. I work full time and I have  pain after a week. I feel tight when turning my neck to drive and to see some of my patients in dental chair. I really feel tight all the time and it has been for years   Hand dominance: Right  PERTINENT HISTORY:  ACL surgery . 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 Yes: NPRS scale: tightness now. 6/10 pain  10/10 when shoulder hits doorway painful Pain location: neck/ rhomboids and upper back  Pain description: shooting pain up my spine when I hit bil shoulder (like running in to door Aggravating factors: after a day of dentistry mostly Thursday afternoons after a week headaches once a week in neck region  Relieving factors: advil  PRECAUTIONS: None  RED FLAGS: None     WEIGHT BEARING RESTRICTIONS: No  FALLS:  Has patient fallen in last 6 months? No  LIVING ENVIRONMENT: Lives with: lives with their spouse Lives in: House/apartment Stairs: no issue with stairs.  Has following equipment at home: None  OCCUPATION: Dentist  PLOF: Independent  PATIENT GOALS: to be more flexible reduced  NEXT MD VISIT: TBD  OBJECTIVE:  Note: Objective measures were completed at Evaluation unless otherwise noted.  DIAGNOSTIC FINDINGS:  None recent but by pt report  bulging disc at C 4/5 , 5/6 and 6/7  PATIENT SURVEYS:  FOTO 83%  predicited  77%  adusted to 87%  COGNITION: Overall cognitive status: Within functional limits for tasks assessed  SENSATION: Sometime transient paresthesia from  the bulging disc at night  POSTURE: rounded shoulders, forward head, and flexed trunk   PALPATION: Pt with moderate to severe tightness over bil UT/LS/ upper back/ rhomboids and cervical paraspinals   CERVICAL ROM:   Active ROM A/PROM (deg) eval  Flexion 35  Extension 45  Right lateral flexion 20  Left lateral flexion 22  Right rotation 47  Left rotation 50   (Blank rows = not tested)  UPPER EXTREMITY ROM:  Active ROM Right eval  Left eval  Shoulder flexion 140 148  Shoulder extension    Shoulder abduction 140 155  Shoulder adduction    Shoulder extension    Shoulder internal rotation 80 90  Shoulder external rotation    Elbow flexion    Elbow extension    Wrist flexion    Wrist extension    Wrist ulnar deviation    Wrist radial deviation    Wrist pronation    Wrist supination     (Blank rows = not tested)  UPPER EXTREMITY MMT: grossly  4+/5 and overall muscular tightness  MMT Right eval Left eval  Shoulder flexion    Shoulder extension    Shoulder abduction    Shoulder adduction    Shoulder extension    Shoulder internal rotation    Shoulder external rotation    Middle trapezius    Lower trapezius    Elbow flexion    Elbow extension    Wrist flexion    Wrist extension    Wrist ulnar deviation    Wrist radial deviation    Wrist pronation    Wrist supination    Grip strength 110lb  115   (Blank rows = not tested)  CERVICAL SPECIAL TESTS:  Neck flexor muscle endurance test: Positive, Spurling's test: Negative, and Distraction test: Negative  FUNCTIONAL TESTS:  5 times sit to stand: 10.22 sec DNF test   9.86 sec [facility] Adult PT Treatment:                                                DATE: 07-14-23 Therapeutic Exercise: Cat Cow to Child's Pose  1 x 10 reps Pigeon Pose with Gluteal Activation  -1eps -  30 sec hold  Quadruped Adductor Stretch 3 reps - 30-45 sec hold Serratus Activation at Wall with Foam Roll and RTB Manual Therapy: STWbil  bil UT/LS, L and R rhomboids.  C-3  to T-8 Right , C-3 to T-8 Left  Right subscapularis Trigger Point Dry Needling  Subsequent Treatment: Instructions reviewed, if requested by the patient, prior to subsequent dry needling treatment.   Patient Verbal Consent Given: Yes Education Handout Provided: Previously Provided Muscles Treated: bil  bil UT/LS, L and R rhomboids.  C-3  to T-8 Right , C-3 to T-8 Left  Right subscapularis Electrical Stimulation  Performed: 25 pps and intensity increased every 2 min at pt tolerance Treatment Response/Outcome: Twitch response elicited and Palpable decrease in muscle tension     TREATMENT DATE: 06-16-23  Eval and issue HEP  Trigger Point Dry-Needling  Treatment instructions: Expect mild to moderate muscle soreness. S/S of pneumothorax if dry needled over a lung field, and to seek immediate medical attention should they occur. Patient verbalized understanding of these instructions and education.  Patient Consent Given: Yes Education handout provided: Yes Muscles treated: bil UT/LS, L and R rhomboids. T- 4 to T-8 Right, Right subscapularis Electrical stimulation performed: No Parameters: N/A Treatment response/outcome: multiple Trigger point twitches and decreased muscle tension.                                                                                                                                  PATIENT EDUCATION:  Education details: POC Explanation of findings, issue HEP, TPDN education Person educated: Patient Education method: Explanation, Demonstration, Tactile cues, Verbal cues, and Handouts Education comprehension: verbalized understanding, returned demonstration, verbal cues required, tactile cues required, and needs further education  HOME EXERCISE PROGRAM: Access Code: [email or web address] URL: [email or web address] Date: 06/16/2023 Prepared by: Garen Lah  Exercises - Shoulder External Rotation and Scapular Retraction with Resistance  - 2 x daily - 7 x weekly - 3 sets - 10 reps - Shoulder Extension with Resistance - Palms Forward  - 1 x daily - 7 x weekly - 3 sets - 10 reps - Seated Scapular Retraction  - 1 x daily - 7 x weekly - 3 sets - 10 reps - Supine Cervical Retraction with Towel  - 1 x daily - 7 x weekly - 1 sets - 10 reps - Seated Levator Scapulae Stretch  - 1 x daily - 7 x weekly - 1 sets - 3 reps - 30 hold - Seated Cervical Sidebending Stretch  - 1 x daily  - 7 x weekly - 1 sets - 3 reps - 30 hold - Doorway Pec Stretch at 90 Degrees Abduction  - 1 x daily - 7 x weekly - 3 sets - 10 reps - Sidelying Open Book Thoracic Lumbar Rotation and Extension  - 1 x daily - 7 x weekly - 2 sets - 10 reps Added 07-14-23 - Cat Cow to Child's Pose  - 1  x daily - 7 x weekly - 3 sets - 10 reps - [employer] with Gluteal Activation  - 1 x daily - 7 x weekly - 1 sets - 3 reps - 30 sec hold - Quadruped Adductor Stretch  - 1 x daily - 7 x weekly - 1 sets - 3 reps - 30-45 sec hold - Serratus Activation at Wall with Foam Roll and Resistance Band  - 1 x daily - 7 x weekly - 3 sets - 10 reps ASSESSMENT:  CLINICAL IMPRESSION: 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  Pt returns for TPDN for R side neck and shoulder stiffness. Pt is able to participate fully in exercise and HEP updated today. Will continue to progress as pt is able to tolerate for strengthening and mobility.    EVAL- Patient is a 64 y.o. male who was seen today for physical therapy evaluation and treatment for cervicalgia and thoracic neck pain and weekly headaches due to muscular tightness, abnormal posture, muscle weakness and decreased AROM. 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  He also is noticing rotational tightness with golfing and leisure activities.  Pt will benefit from skilled PT to return to decreased headaches and more comfortable motion in daily activities and leisure activities..   OBJECTIVE IMPAIRMENTS: decreased activity tolerance, decreased ROM, decreased strength, increased fascial restrictions, impaired flexibility, improper body mechanics, postural dysfunction, and pain.   ACTIVITY LIMITATIONS: carrying, lifting, bending, reach over head, and caring for others  PARTICIPATION LIMITATIONS: community activity, occupation, and golfing  PERSONAL FACTORS:  HTN, Afib,  bulging disc in C-4, 5/6 and 6/7 are also affecting patient's functional outcome.   REHAB POTENTIAL: Good  CLINICAL DECISION MAKING: Stable/uncomplicated  EVALUATION COMPLEXITY: Low   GOALS: Goals reviewed with patient? Yes  SHORT TERM GOALS: Target date: 07-20-22  Pt will be independent with initial HEP Baseline: HEP issued Goal status: ONGOING  2.  Patient will report neck/shoulder pain level </= 5/10 in order to reduce functional limitations and improve activity tolerance.  Baseline: Pt with pain in shoulders up to 10/10 when shoulder makes contact with wall,   also neck pain up to 6/10 with decreased movement and work tolerance Goal status: ONGOING  3.  Demonstrate understanding of proper sitting posture, body mechanics, work ergonomics, and be more conscious of position and posture throughout the day.  Baseline: Pt with forward head and work positions with poor body mechanics Goal status: ONGOING    LONG TERM GOALS: Target date: 08-25-22  Patient will be I with advanced HEP to maintain progress from PT.  Baseline: no knowledge Goal status: INITIAL  2.  Pt will experience 50% greater ease leisure activities such as golfing Baseline Pt with pain while golfing  Goal status: INITIAL  3.  Pt with reduction of headaches 2 x a month Baseline: Pt with weekly HA Goal status: INITIAL  4.   FOTO will improve from  83%  to  87%   indicating improved functional mobility.  Baseline: 83% Goal status: INITIAL  5.  Improved cervical rotation to allow checking blind spots while driving with minimal discomfort Baseline: See AROM chart Goal status: INITIAL  6.  Pt will  improve shoulder AROM and MMT in order to use weight overhead with proper form Baseline: Not using weights routinely at present Goal status: INITIAL  7. Pt will be able to  perform DNF supine test for at least 35 seconds to show improved neck strength  Baseline: 9.86 sec  Goal Status INITIAL   PLAN:  PT  FREQUENCY: 1x/week  PT DURATION: 10 weeks  PLANNED INTERVENTIONS: 97164- PT Re-evaluation, 97110-Therapeutic exercises, 97530- Therapeutic activity, 97112- Neuromuscular re-education, 97535- Self Care, 28413- Manual therapy, 97014- Electrical stimulation (unattended), [phone]- Electrical stimulation (manual), Patient/Family education, Taping, Dry Needling, Joint mobilization, Spinal mobilization, Cryotherapy, and Moist heat  PLAN FOR NEXT SESSION: TPDN, progress neck and upper back and shoulder HEP as needed Manual  body mechanics and handout  scalenes next visit   Garen Lah, PT, ATRIC Certified Exercise Expert for the Aging Adult  07/14/23 12:39 PM Phone: [phone] Fax: [phone]   Garen Lah, PT, ATRIC Certified Exercise Expert for the Aging Adult  07/14/23 4:07 PM Phone: [phone] Fax: [phone]

## 2023-07-21 NOTE — Therapy (Incomplete)
OUTPATIENT PHYSICAL THERAPY CERVICAL TREATMENT   Patient Name: Colin Stanley MRN: [id number] DOB:June 10, 1961, 63 y.o., male Today's Date: 07/21/2023  END OF SESSION:     Past Medical History:  Diagnosis Date   Dyslipidemia    Hypertension    Paroxysmal atrial fibrillation (HCC)    No past surgical history on file. Patient Active Problem List   Diagnosis Date Noted   Palpitations 06/01/2019   Dyslipidemia 03/05/2009   Essential hypertension 03/05/2009   ATRIAL FIBRILLATION, PAROXYSMAL 03/05/2009    PCP: Andi Devon, MD   REFERRING PROVIDER: Andi Devon, MD   REFERRING DIAG: M25.511 (ICD-10-CM) - Pain in right shoulder   THERAPY DIAG:  No diagnosis found.  Rationale for Evaluation and Treatment: Rehabilitation  ONSET DATE: about 20 years and job related  SUBJECTIVE:                                                                                                                                                                                                         SUBJECTIVE STATEMENT: I have noticed a difference by doing the exercises and after TPDN.  I sold my practice and my blood pressure has come down.  I was able to play golf with greater ease this week.   EVAL- I have had tightness and pain in neck and upper back for years. 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  I also play golf.  But I have a lot of muscle knots in back for years.   I have headache once a week regularly. I work full time and I have pain after a week. I feel tight when turning my neck to drive and to see some of my patients in dental chair. I really feel tight all the time and it has been for years   Hand dominance: Right  PERTINENT HISTORY:  ACL surgery . 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 Yes: NPRS scale: tightness now. 6/10 pain  10/10 when shoulder hits doorway painful Pain location: neck/  rhomboids and upper back  Pain description: shooting pain up my spine when I hit bil shoulder (like running in to door Aggravating factors: after a day of dentistry mostly Thursday afternoons after a week headaches once a week in neck region  Relieving factors: advil  PRECAUTIONS: None  RED FLAGS: None     WEIGHT BEARING RESTRICTIONS: No  FALLS:  Has patient fallen in last 6 months? No  LIVING ENVIRONMENT:  Lives with: lives with their spouse Lives in: House/apartment Stairs: no issue with stairs.  Has following equipment at home: None  OCCUPATION: Dentist  PLOF: Independent  PATIENT GOALS: to be more flexible reduced  NEXT MD VISIT: TBD  OBJECTIVE:  Note: Objective measures were completed at Evaluation unless otherwise noted.  DIAGNOSTIC FINDINGS:  None recent but by pt report  bulging disc at C 4/5 , 5/6 and 6/7  PATIENT SURVEYS:  FOTO 83%  predicited  77%  adusted to 87%  COGNITION: Overall cognitive status: Within functional limits for tasks assessed  SENSATION: Sometime transient paresthesia from  the bulging disc at night  POSTURE: rounded shoulders, forward head, and flexed trunk   PALPATION: Pt with moderate to severe tightness over bil UT/LS/ upper back/ rhomboids and cervical paraspinals   CERVICAL ROM:   Active ROM A/PROM (deg) eval  Flexion 35  Extension 45  Right lateral flexion 20  Left lateral flexion 22  Right rotation 47  Left rotation 50   (Blank rows = not tested)  UPPER EXTREMITY ROM:  Active ROM Right eval Left eval  Shoulder flexion 140 148  Shoulder extension    Shoulder abduction 140 155  Shoulder adduction    Shoulder extension    Shoulder internal rotation 80 90  Shoulder external rotation    Elbow flexion    Elbow extension    Wrist flexion    Wrist extension    Wrist ulnar deviation    Wrist radial deviation    Wrist pronation    Wrist supination     (Blank rows = not tested)  UPPER EXTREMITY MMT: grossly   4+/5 and overall muscular tightness  MMT Right eval Left eval  Shoulder flexion    Shoulder extension    Shoulder abduction    Shoulder adduction    Shoulder extension    Shoulder internal rotation    Shoulder external rotation    Middle trapezius    Lower trapezius    Elbow flexion    Elbow extension    Wrist flexion    Wrist extension    Wrist ulnar deviation    Wrist radial deviation    Wrist pronation    Wrist supination    Grip strength 110lb  115   (Blank rows = not tested)  CERVICAL SPECIAL TESTS:  Neck flexor muscle endurance test: Positive, Spurling's test: Negative, and Distraction test: Negative  FUNCTIONAL TESTS:  5 times sit to stand: 10.22 sec DNF test   9.86 sec  [facility] Adult PT Treatment:                                                DATE: 1--30-25 Therapeutic Exercise: *** Manual Therapy: *** Neuromuscular re-ed: *** Therapeutic Activity: *** Modalities: *** Self Care: ***  [facility] Adult PT Treatment:                                                DATE: 07-14-23 Therapeutic Exercise: Cat Cow to Child's Pose  1 x 10 reps Pigeon Pose with Gluteal Activation  -1eps - 30 sec hold  Quadruped Adductor Stretch 3 reps - 30-45 sec hold Serratus Activation at Wall with Foam Roll and RTB Manual Therapy:  STWbil  bil UT/LS, L and R rhomboids.  C-3  to T-8 Right , C-3 to T-8 Left  Right subscapularis Trigger Point Dry Needling  Subsequent Treatment: Instructions reviewed, if requested by the patient, prior to subsequent dry needling treatment.   Patient Verbal Consent Given: Yes Education Handout Provided: Previously Provided Muscles Treated: bil  bil UT/LS, L and R rhomboids.  C-3  to T-8 Right , C-3 to T-8 Left  Right subscapularis Electrical Stimulation Performed: 25 pps and intensity increased every 2 min at pt tolerance Treatment Response/Outcome: Twitch response elicited and Palpable decrease in muscle tension     TREATMENT DATE: 06-16-23  Eval and  issue HEP  Trigger Point Dry-Needling  Treatment instructions: Expect mild to moderate muscle soreness. S/S of pneumothorax if dry needled over a lung field, and to seek immediate medical attention should they occur. Patient verbalized understanding of these instructions and education.  Patient Consent Given: Yes Education handout provided: Yes Muscles treated: bil UT/LS, L and R rhomboids. T- 4 to T-8 Right, Right subscapularis Electrical stimulation performed: No Parameters: N/A Treatment response/outcome: multiple Trigger point twitches and decreased muscle tension.                                                                                                                                  PATIENT EDUCATION:  Education details: POC Explanation of findings, issue HEP, TPDN education Person educated: Patient Education method: Explanation, Demonstration, Tactile cues, Verbal cues, and Handouts Education comprehension: verbalized understanding, returned demonstration, verbal cues required, tactile cues required, and needs further education  HOME EXERCISE PROGRAM: Access Code: [email or web address] URL: [email or web address] Date: 06/16/2023 Prepared by: Garen Lah  Exercises - Shoulder External Rotation and Scapular Retraction with Resistance  - 2 x daily - 7 x weekly - 3 sets - 10 reps - Shoulder Extension with Resistance - Palms Forward  - 1 x daily - 7 x weekly - 3 sets - 10 reps - Seated Scapular Retraction  - 1 x daily - 7 x weekly - 3 sets - 10 reps - Supine Cervical Retraction with Towel  - 1 x daily - 7 x weekly - 1 sets - 10 reps - Seated Levator Scapulae Stretch  - 1 x daily - 7 x weekly - 1 sets - 3 reps - 30 hold - Seated Cervical Sidebending Stretch  - 1 x daily - 7 x weekly - 1 sets - 3 reps - 30 hold - Doorway Pec Stretch at 90 Degrees Abduction  - 1 x daily - 7 x weekly - 3 sets - 10 reps - Sidelying Open Book Thoracic Lumbar Rotation and Extension  - 1 x  daily - 7 x weekly - 2 sets - 10 reps Added 07-14-23 - Cat Cow to Child's Pose  - 1 x daily - 7 x weekly - 3 sets - 10 reps - Pigeon Pose with Gluteal Activation  - 1 x daily -  7 x weekly - 1 sets - 3 reps - 30 sec hold - Quadruped Adductor Stretch  - 1 x daily - 7 x weekly - 1 sets - 3 reps - 30-45 sec hold - Serratus Activation at Wall with Foam Roll and Resistance Band  - 1 x daily - 7 x weekly - 3 sets - 10 reps ASSESSMENT:  CLINICAL IMPRESSION: 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  Pt returns for TPDN for R side neck and shoulder stiffness. Pt is able to participate fully in exercise and HEP updated today. Will continue to progress as pt is able to tolerate for strengthening and mobility.    EVAL- Patient is a 63 y.o. male who was seen today for physical therapy evaluation and treatment for cervicalgia and thoracic neck pain and weekly headaches due to muscular tightness, abnormal posture, muscle weakness and decreased AROM. 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  He also is noticing rotational tightness with golfing and leisure activities.  Pt will benefit from skilled PT to return to decreased headaches and more comfortable motion in daily activities and leisure activities..   OBJECTIVE IMPAIRMENTS: decreased activity tolerance, decreased ROM, decreased strength, increased fascial restrictions, impaired flexibility, improper body mechanics, postural dysfunction, and pain.   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   REHAB POTENTIAL: Good  CLINICAL DECISION MAKING: Stable/uncomplicated  EVALUATION COMPLEXITY:  Low   GOALS: Goals reviewed with patient? Yes  SHORT TERM GOALS: Target date: 07-20-22  Pt will be independent with initial HEP Baseline: HEP issued Goal status: ONGOING  2.  Patient will report neck/shoulder pain level </= 5/10 in order to reduce functional limitations and improve activity tolerance.  Baseline: Pt with pain in shoulders up to 10/10 when shoulder makes contact with wall,   also neck pain up to 6/10 with decreased movement and work tolerance Goal status: ONGOING  3.  Demonstrate understanding of proper sitting posture, body mechanics, work ergonomics, and be more conscious of position and posture throughout the day.  Baseline: Pt with forward head and work positions with poor body mechanics Goal status: ONGOING    LONG TERM GOALS: Target date: 08-25-22  Patient will be I with advanced HEP to maintain progress from PT.  Baseline: no knowledge Goal status: INITIAL  2.  Pt will experience 50% greater ease leisure activities such as golfing Baseline Pt with pain while golfing  Goal status: INITIAL  3.  Pt with reduction of headaches 2 x a month Baseline: Pt with weekly HA Goal status: INITIAL  4.   FOTO will improve from  83%  to  87%   indicating improved functional mobility.  Baseline: 83% Goal status: INITIAL  5.  Improved cervical rotation to allow checking blind spots while driving with minimal discomfort Baseline: See AROM chart Goal status: INITIAL  6.  Pt will  improve shoulder AROM and MMT in order to use weight overhead with proper form Baseline: Not using weights routinely at present Goal status: INITIAL  7. Pt will be able to perform DNF supine test for at least 35 seconds to show improved neck strength  Baseline: 9.86 sec  Goal Status INITIAL  PLAN:  PT FREQUENCY: 1x/week  PT DURATION: 10 weeks  PLANNED INTERVENTIONS: 97164- PT Re-evaluation, 97110-Therapeutic exercises, 97530- Therapeutic activity, 97112- Neuromuscular re-education,  97535- Self Care, 16109- Manual therapy, 97014- Electrical stimulation (unattended), [id number]- Electrical stimulation (manual), Patient/Family education, Taping, Dry Needling, Joint mobilization, Spinal mobilization, Cryotherapy, and Moist heat  PLAN FOR NEXT SESSION: TPDN, progress neck and upper back and shoulder HEP as needed Manual  body mechanics and handout  scalenes next visit   ***

## 2023-07-26 ENCOUNTER — Ambulatory Visit: Payer: PRIVATE HEALTH INSURANCE | Admitting: Physical Therapy

## 2023-08-02 ENCOUNTER — Encounter: Payer: PRIVATE HEALTH INSURANCE | Admitting: Physical Therapy

## 2023-08-04 ENCOUNTER — Other Ambulatory Visit: Payer: Self-pay | Admitting: Internal Medicine

## 2023-08-04 DIAGNOSIS — R002 Palpitations: Secondary | ICD-10-CM

## 2023-08-04 DIAGNOSIS — I10 Essential (primary) hypertension: Secondary | ICD-10-CM

## 2023-08-09 ENCOUNTER — Ambulatory Visit: Payer: PRIVATE HEALTH INSURANCE | Attending: Internal Medicine | Admitting: Physical Therapy

## 2023-08-09 ENCOUNTER — Encounter: Payer: Self-pay | Admitting: Physical Therapy

## 2023-08-09 DIAGNOSIS — M6281 Muscle weakness (generalized): Secondary | ICD-10-CM | POA: Diagnosis present

## 2023-08-09 DIAGNOSIS — R293 Abnormal posture: Secondary | ICD-10-CM | POA: Diagnosis present

## 2023-08-09 DIAGNOSIS — M542 Cervicalgia: Secondary | ICD-10-CM | POA: Insufficient documentation

## 2023-08-09 DIAGNOSIS — M546 Pain in thoracic spine: Secondary | ICD-10-CM | POA: Diagnosis present

## 2023-08-09 NOTE — Therapy (Signed)
 OUTPATIENT PHYSICAL THERAPY CERVICAL TREATMENT   Patient Name: Colin Stanley MRN: [id number] DOB:[birth year], 63 y.o., male Today's Date: 08/09/2023  END OF SESSION:  PT End of Session - 08/09/23 1247     Visit Number 3    Number of Visits 12    Date for PT Re-Evaluation 08/25/23    Authorization Type [employer]    PT Start Time 1145    PT Stop Time 1230    PT Time Calculation (min) 45 min    Activity Tolerance Patient tolerated treatment well    Behavior During Therapy WFL for tasks assessed/performed               Past Medical History:  Diagnosis Date   Dyslipidemia    Hypertension    Paroxysmal atrial fibrillation (HCC)    No past surgical history on file. Patient Active Problem List   Diagnosis Date Noted   Palpitations 06/01/2019   Dyslipidemia 03/05/2009   Essential hypertension 03/05/2009   ATRIAL FIBRILLATION, PAROXYSMAL 03/05/2009    PCP: Andi Devon, MD   REFERRING PROVIDER: Andi Devon, MD   REFERRING DIAG: M25.511 (ICD-10-CM) - Pain in right shoulder   THERAPY DIAG:  Cervicalgia  Pain in thoracic spine  Abnormal posture  Muscle weakness (generalized)  Rationale for Evaluation and Treatment: Rehabilitation  ONSET DATE: about 20 years and job related  SUBJECTIVE:                                                                                                                                                                                                         SUBJECTIVE STATEMENT: I have noticed a difference by doing the exercises and after TPDN.  I feel like I was able to hit my golf swing more easily  not as tight.     EVAL- I have had tightness and pain in neck and upper back for years. 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  I also play golf.  But I have a lot of muscle knots in back for years.   I have headache once a week regularly. I work full time and I have pain after a week. I feel tight  when turning my neck to drive and to see some of my patients in dental chair. I really feel tight all the time and it has been for years   Hand dominance: Right  PERTINENT HISTORY:  ACL surgery . 10 years, HTN, Afib, bulging disc in C-4, 5/6 and 6/7  PAIN:  Are you  having pain? Yes: NPRS scale: tightness now. 6/10 pain  10/10 when shoulder hits doorway painful Pain location: neck/ rhomboids and upper back  Pain description: shooting pain up my spine when I hit bil shoulder (like running in to door Aggravating factors: after a day of dentistry mostly Thursday afternoons after a week headaches once a week in neck region  Relieving factors: advil  PRECAUTIONS: None  RED FLAGS: None     WEIGHT BEARING RESTRICTIONS: No  FALLS:  Has patient fallen in last 6 months? No  LIVING ENVIRONMENT: Lives with: lives with their spouse Lives in: House/apartment Stairs: no issue with stairs.  Has following equipment at home: None  OCCUPATION: Dentist  PLOF: Independent  PATIENT GOALS: to be more flexible reduced  NEXT MD VISIT: TBD  OBJECTIVE:  Note: Objective measures were completed at Evaluation unless otherwise noted.  DIAGNOSTIC FINDINGS:  None recent but by pt report  bulging disc at C 4/5 , 5/6 and 6/7  PATIENT SURVEYS:  FOTO 83%  predicited  77%  adusted to 87%  COGNITION: Overall cognitive status: Within functional limits for tasks assessed  SENSATION: Sometime transient paresthesia from  the bulging disc at night  POSTURE: rounded shoulders, forward head, and flexed trunk   PALPATION: Pt with moderate to severe tightness over bil UT/LS/ upper back/ rhomboids and cervical paraspinals   CERVICAL ROM:   Active ROM A/PROM (deg) eval  Flexion 35  Extension 45  Right lateral flexion 20  Left lateral flexion 22  Right rotation 47  Left rotation 50   (Blank rows = not tested)  UPPER EXTREMITY ROM:  Active ROM Right eval Left eval  Shoulder flexion 140 148   Shoulder extension    Shoulder abduction 140 155  Shoulder adduction    Shoulder extension    Shoulder internal rotation 80 90  Shoulder external rotation    Elbow flexion    Elbow extension    Wrist flexion    Wrist extension    Wrist ulnar deviation    Wrist radial deviation    Wrist pronation    Wrist supination     (Blank rows = not tested)  UPPER EXTREMITY MMT: grossly  4+/5 and overall muscular tightness  MMT Right eval Left eval  Shoulder flexion    Shoulder extension    Shoulder abduction    Shoulder adduction    Shoulder extension    Shoulder internal rotation    Shoulder external rotation    Middle trapezius    Lower trapezius    Elbow flexion    Elbow extension    Wrist flexion    Wrist extension    Wrist ulnar deviation    Wrist radial deviation    Wrist pronation    Wrist supination    Grip strength 110lb  115   (Blank rows = not tested)  CERVICAL SPECIAL TESTS:  Neck flexor muscle endurance test: Positive, Spurling's test: Negative, and Distraction test: Negative  FUNCTIONAL TESTS:  5 times sit to stand: 10.22 sec DNF test   9.86 sec  [facility] Adult PT Treatment:                                                DATE: 08-09-23 Therapeutic Exercise: ER abd of bil shoulders and angel wings 2 x 15 with GTB Prone on shoulders and posterior neck  with with chin tuck and GTB 2 x 15 Using KB 10 lb with sustained 90/90 ER abd of should and hold for 1 min on R and then L Manual Therapy: STW   RUT/LS,  R rhomboids.  C-3  to T-8 Right Right subscapularis, Right infraspinatus Sub occipital release.  UPA C- 2 to C-7 on Right, end of range overpressure Sidelyng on left  posterior shoulder release  Trigger Point Dry Needling  Subsequent Treatment: Instructions provided previously at initial dry needling treatment.  Instructions reviewed, if requested by the patient, prior to subsequent dry needling treatment.   Patient Verbal Consent Given: Yes Education Handout  Provided: Yes Muscles Treated:  RUT/LS,  R rhomboids.  C-3  to T-8 Right Right subscapularis, Right infraspinatus Electrical Stimulation Performed: No Treatment Response/Outcome: muscle tension decrease   Self Care: Discussed community wellness and continuing with trainer for ongoing consistent strengthening   [facility] Adult PT Treatment:                                                DATE: 07-14-23 Therapeutic Exercise: Cat Cow to Child's Pose  1 x 10 reps [employer] with Gluteal Activation  -1eps - 30 sec hold  Quadruped Adductor Stretch 3 reps - 30-45 sec hold Serratus Activation at Wall with Foam Roll and RTB Manual Therapy: STWbil  bil UT/LS, L and R rhomboids.  C-3  to T-8 Right , C-3 to T-8 Left  Right subscapularis Trigger Point Dry Needling  Subsequent Treatment: Instructions reviewed, if requested by the patient, prior to subsequent dry needling treatment.   Patient Verbal Consent Given: Yes Education Handout Provided: Previously Provided Muscles Treated: bil  bil UT/LS, L and R rhomboids.  C-3  to T-8 Right , C-3 to T-8 Left  Right subscapularis Electrical Stimulation Performed: 25 pps and intensity increased every 2 min at pt tolerance Treatment Response/Outcome: Twitch response elicited and Palpable decrease in muscle tension     TREATMENT DATE: 06-16-23  Eval and issue HEP  Trigger Point Dry-Needling  Treatment instructions: Expect mild to moderate muscle soreness. S/S of pneumothorax if dry needled over a lung field, and to seek immediate medical attention should they occur. Patient verbalized understanding of these instructions and education.  Patient Consent Given: Yes Education handout provided: Yes Muscles treated: bil UT/LS, L and R rhomboids. T- 4 to T-8 Right, Right subscapularis Electrical stimulation performed: No Parameters: N/A Treatment response/outcome: multiple Trigger point twitches and decreased muscle tension.                                                                                                                                   PATIENT EDUCATION:  Education details: POC Explanation of findings, issue HEP, TPDN education Person educated: Patient Education method: Explanation, Demonstration, Tactile cues,  Verbal cues, and Handouts Education comprehension: verbalized understanding, returned demonstration, verbal cues required, tactile cues required, and needs further education  HOME EXERCISE PROGRAM: Access Code: [email or web address] URL: [email or web address] Date: 06/16/2023 Prepared by: Garen Lah  Exercises - Shoulder External Rotation and Scapular Retraction with Resistance  - 2 x daily - 7 x weekly - 3 sets - 10 reps - Shoulder Extension with Resistance - Palms Forward  - 1 x daily - 7 x weekly - 3 sets - 10 reps - Seated Scapular Retraction  - 1 x daily - 7 x weekly - 3 sets - 10 reps - Supine Cervical Retraction with Towel  - 1 x daily - 7 x weekly - 1 sets - 10 reps - Seated Levator Scapulae Stretch  - 1 x daily - 7 x weekly - 1 sets - 3 reps - 30 hold - Seated Cervical Sidebending Stretch  - 1 x daily - 7 x weekly - 1 sets - 3 reps - 30 hold - Doorway Pec Stretch at 90 Degrees Abduction  - 1 x daily - 7 x weekly - 3 sets - 10 reps - Sidelying Open Book Thoracic Lumbar Rotation and Extension  - 1 x daily - 7 x weekly - 2 sets - 10 reps Added 07-14-23 - Cat Cow to Child's Pose  - 1 x daily - 7 x weekly - 3 sets - 10 reps - [employer] with Gluteal Activation  - 1 x daily - 7 x weekly - 1 sets - 3 reps - 30 sec hold - Quadruped Adductor Stretch  - 1 x daily - 7 x weekly - 1 sets - 3 reps - 30-45 sec hold - Serratus Activation at Wall with Foam Roll and Resistance Band  - 1 x daily - 7 x weekly - 3 sets - 10 reps ASSESSMENT:  CLINICAL IMPRESSION: 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   Pt returns for TPDN for R side neck and shoulder  stiffness.and is closely monitored throughout session.  Pt shown additional exercises to increase strength of posterior chain of neck and subscapular stabilizers.  Also educated on importance of consistent strengthening in community with personal fitness trainer post DC. Will continue to progress as pt is able to tolerate for strengthening and mobility.    EVAL- Patient is a 63 y.o. male who was seen today for physical therapy evaluation and treatment for cervicalgia and thoracic neck pain and weekly headaches due to muscular tightness, abnormal posture, muscle weakness and decreased AROM. 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  He also is noticing rotational tightness with golfing and leisure activities.  Pt will benefit from skilled PT to return to decreased headaches and more comfortable motion in daily activities and leisure activities..   OBJECTIVE IMPAIRMENTS: decreased activity tolerance, decreased ROM, decreased strength, increased fascial restrictions, impaired flexibility, improper body mechanics, postural dysfunction, and pain.   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   REHAB POTENTIAL: Good  CLINICAL DECISION MAKING: Stable/uncomplicated  EVALUATION COMPLEXITY: Low   GOALS: Goals reviewed with patient? Yes  SHORT TERM GOALS: Target date: 07-20-22  Pt will be independent with initial HEP Baseline: HEP issued Goal status: MET  2.  Patient will report neck/shoulder pain level </= 5/10 in order to reduce functional limitations and improve activity tolerance.  Baseline: Pt with pain in shoulders up to 10/10 when shoulder makes contact with wall,   also neck pain up to 6/10 with decreased movement and  work tolerance Goal status: ONGOING  3.  Demonstrate understanding of proper sitting posture, body mechanics, work ergonomics, and be more conscious of position and posture throughout the day.  Baseline: Pt with forward head and work positions with poor body mechanics Goal status: ONGOING    LONG TERM GOALS: Target date: 08-25-22  Patient will be I with advanced HEP to maintain progress from PT.  Baseline: no knowledge Goal status: INITIAL  2.  Pt will experience 50% greater ease leisure activities such as golfing Baseline Pt with pain while golfing  Goal status: INITIAL  3.  Pt with reduction of headaches 2 x a month Baseline: Pt with weekly HA Goal status: INITIAL  4.   FOTO will improve from  83%  to  87%   indicating improved functional mobility.  Baseline: 83% Goal status: INITIAL  5.  Improved cervical rotation to allow checking blind spots while driving with minimal discomfort Baseline: See AROM chart Goal status: INITIAL  6.  Pt will  improve shoulder AROM and MMT in order to use weight overhead with proper form Baseline: Not using weights routinely at present Goal status: INITIAL  7. Pt will be able to perform DNF supine test for at least 35 seconds to show improved neck strength  Baseline: 9.86 sec  Goal Status INITIAL   PLAN:  PT FREQUENCY: 1x/week  PT DURATION: 10 weeks  PLANNED INTERVENTIONS: 97164- PT Re-evaluation, 97110-Therapeutic exercises, 97530- Therapeutic activity, 97112- Neuromuscular re-education, 97535- Self Care, 16109- Manual therapy, 97014- Electrical stimulation (unattended), 97032- Electrical stimulation (manual), Patient/Family education, Taping, Dry Needling, Joint mobilization, Spinal mobilization, Cryotherapy, and Moist heat  PLAN FOR NEXT SESSION: TPDN, progress neck and upper back and shoulder HEP as needed Manual  body mechanics and handout  scalenes next visit   Garen Lah, PT, [facility] Certified Exercise Expert for the Aging  Adult  08/09/23 12:50 PM Phone: [phone] Fax: [phone]

## 2023-08-11 NOTE — Therapy (Signed)
 OUTPATIENT PHYSICAL THERAPY CERVICAL TREATMENT   Patient Name: Colin Stanley MRN: [id number] DOB:09/20/1960, 63 y.o., male Today's Date: 08/16/2023  END OF SESSION:  PT End of Session - 08/16/23 1148     Visit Number 4    Number of Visits 12    Date for PT Re-Evaluation 08/25/23    Authorization Type [employer]    PT Start Time 1147    PT Stop Time 1230    PT Time Calculation (min) 43 min    Activity Tolerance Patient tolerated treatment well    Behavior During Therapy WFL for tasks assessed/performed                Past Medical History:  Diagnosis Date   Dyslipidemia    Hypertension    Paroxysmal atrial fibrillation (HCC)    History reviewed. No pertinent surgical history. Patient Active Problem List   Diagnosis Date Noted   Palpitations 06/01/2019   Dyslipidemia 03/05/2009   Essential hypertension 03/05/2009   ATRIAL FIBRILLATION, PAROXYSMAL 03/05/2009    PCP: Andi Devon, MD   REFERRING PROVIDER: Andi Devon, MD   REFERRING DIAG: M25.511 (ICD-10-CM) - Pain in right shoulder   THERAPY DIAG:  Cervicalgia  Pain in thoracic spine  Muscle weakness (generalized)  Abnormal posture  Rationale for Evaluation and Treatment: Rehabilitation  ONSET DATE: about 20 years and job related  SUBJECTIVE:                                                                                                                                                                                                         SUBJECTIVE STATEMENT: I noticed that I could move my neck better backing the car out from the cabin.  I did notice this weekend that I had more spasms in my lower thoracic muscles.   EVAL- I have had tightness and pain in neck and upper back for years. 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  I also play golf.  But I have a lot of muscle knots in back for years.   I have headache once a week regularly. I work full time and I have  pain after a week. I feel tight when turning my neck to drive and to see some of my patients in dental chair. I really feel tight all the time and it has been for years   Hand dominance: Right  PERTINENT HISTORY:  ACL surgery . 10 years, HTN, Afib, bulging disc in C-4, 5/6 and 6/7  PAIN:  Are  you having pain? Yes: NPRS scale: tightness now. 6/10 pain  10/10 when shoulder hits doorway painful Pain location: neck/ rhomboids and upper back  Pain description: shooting pain up my spine when I hit bil shoulder (like running in to door Aggravating factors: after a day of dentistry mostly Thursday afternoons after a week headaches once a week in neck region  Relieving factors: advil  PRECAUTIONS: None  RED FLAGS: None     WEIGHT BEARING RESTRICTIONS: No  FALLS:  Has patient fallen in last 6 months? No  LIVING ENVIRONMENT: Lives with: lives with their spouse Lives in: House/apartment Stairs: no issue with stairs.  Has following equipment at home: None  OCCUPATION: Dentist  PLOF: Independent  PATIENT GOALS: to be more flexible reduced  NEXT MD VISIT: TBD  OBJECTIVE:  Note: Objective measures were completed at Evaluation unless otherwise noted.  DIAGNOSTIC FINDINGS:  None recent but by pt report  bulging disc at C 4/5 , 5/6 and 6/7  PATIENT SURVEYS:  FOTO 83%  predicited  77%  adusted to 87%  COGNITION: Overall cognitive status: Within functional limits for tasks assessed  SENSATION: Sometime transient paresthesia from  the bulging disc at night  POSTURE: rounded shoulders, forward head, and flexed trunk   PALPATION: Pt with moderate to severe tightness over bil UT/LS/ upper back/ rhomboids and cervical paraspinals   CERVICAL ROM:   Active ROM A/PROM (deg) eval  Flexion 35  Extension 45  Right lateral flexion 20  Left lateral flexion 22  Right rotation 47  Left rotation 50   (Blank rows = not tested)  UPPER EXTREMITY ROM:  Active ROM Right eval  Left eval  Shoulder flexion 140 148  Shoulder extension    Shoulder abduction 140 155  Shoulder adduction    Shoulder extension    Shoulder internal rotation 80 90  Shoulder external rotation    Elbow flexion    Elbow extension    Wrist flexion    Wrist extension    Wrist ulnar deviation    Wrist radial deviation    Wrist pronation    Wrist supination     (Blank rows = not tested)  UPPER EXTREMITY MMT: grossly  4+/5 and overall muscular tightness  MMT Right eval Left eval  Shoulder flexion    Shoulder extension    Shoulder abduction    Shoulder adduction    Shoulder extension    Shoulder internal rotation    Shoulder external rotation    Middle trapezius    Lower trapezius    Elbow flexion    Elbow extension    Wrist flexion    Wrist extension    Wrist ulnar deviation    Wrist radial deviation    Wrist pronation    Wrist supination    Grip strength 110lb  115   (Blank rows = not tested)  CERVICAL SPECIAL TESTS:  Neck flexor muscle endurance test: Positive, Spurling's test: Negative, and Distraction test: Negative  FUNCTIONAL TESTS:  5 times sit to stand: 10.22 sec DNF test   9.86 sec [facility] Adult PT Treatment:                                                DATE: 08-16-23  Manual Therapy: STWbil  bil UT/LS, L and R rhomboids.  C-3  to T-11 Right and Left Right  Sub occipital release.   UPA C- 2 to C-7 bil, end of range overpressure Lumbar roll mobilization with cavitation  R sidelying and L sidelying Bil Scalenes Trigger Point Dry Needling  Subsequent Treatment: Instructions provided previously at initial dry needling treatment.   Patient Verbal Consent Given: Yes Education Handout Provided: Previously Provided Muscles Treated: bil UT/LS, L and R rhomboids.  C-3  to T-11 Right and Left Right  Sub occipital release.   Electrical Stimulation Performed: No Treatment Response/Outcome: Decreased tissue tension    Self Care: Posture education Lifting  technique and safety  Body mechanics and specific for dental job duties [facility] Adult PT Treatment:                                                DATE: 08-09-23 Therapeutic Exercise: ER abd of bil shoulders and angel wings 2 x 15 with GTB Prone on shoulders and posterior neck with with chin tuck and GTB 2 x 15 Using KB 10 lb with sustained 90/90 ER abd of should and hold for 1 min on R and then L Manual Therapy: STW   RUT/LS,  R rhomboids.  C-3  to T-8 Right Right subscapularis, Right infraspinatus Sub occipital release.  UPA C- 2 to C-7 on Right, end of range overpressure Sidelyng on left  posterior shoulder release  Trigger Point Dry Needling  Subsequent Treatment: Instructions provided previously at initial dry needling treatment.  Instructions reviewed, if requested by the patient, prior to subsequent dry needling treatment.   Patient Verbal Consent Given: Yes Education Handout Provided: Yes Muscles Treated:  RUT/LS,  R rhomboids.  C-3  to T-8 Right Right subscapularis, Right infraspinatus Electrical Stimulation Performed: No Treatment Response/Outcome: muscle tension decrease   Self Care: Discussed community wellness and continuing with trainer for ongoing consistent strengthening   [facility] Adult PT Treatment:                                                DATE: 07-14-23 Therapeutic Exercise: Cat Cow to Child's Pose  1 x 10 reps [facility] with Gluteal Activation  -1eps - 30 sec hold  Quadruped Adductor Stretch 3 reps - 30-45 sec hold Serratus Activation at Wall with Foam Roll and RTB Manual Therapy: STWbil  bil UT/LS, L and R rhomboids.  C-3  to T-8 Right , C-3 to T-8 Left  Right subscapularis Trigger Point Dry Needling  Subsequent Treatment: Instructions reviewed, if requested by the patient, prior to subsequent dry needling treatment.   Patient Verbal Consent Given: Yes Education Handout Provided: Previously Provided Muscles Treated: bil  bil UT/LS, L and R rhomboids.  C-3   to T-8 Right , C-3 to T-8 Left  Right subscapularis Electrical Stimulation Performed: 25 pps and intensity increased every 2 min at pt tolerance Treatment Response/Outcome: Twitch response elicited and Palpable decrease in muscle tension     TREATMENT DATE: 06-16-23  Eval and issue HEP  Trigger Point Dry-Needling  Treatment instructions: Expect mild to moderate muscle soreness. S/S of pneumothorax if dry needled over a lung field, and to seek immediate medical attention should they occur. Patient verbalized understanding of these instructions and education.  Patient Consent Given: Yes Education handout provided: Yes Muscles  treated: bil UT/LS, L and R rhomboids. T- 4 to T-8 Right, Right subscapularis Electrical stimulation performed: No Parameters: N/A Treatment response/outcome: multiple Trigger point twitches and decreased muscle tension.                                                                                                                                  PATIENT EDUCATION:  Education details: POC Explanation of findings, issue HEP, TPDN education Person educated: Patient Education method: Explanation, Demonstration, Tactile cues, Verbal cues, and Handouts Education comprehension: verbalized understanding, returned demonstration, verbal cues required, tactile cues required, and needs further education  HOME EXERCISE PROGRAM: Access Code: [email or web address] URL: [email or web address] Date: 06/16/2023 Prepared by: Garen Lah  Exercises - Shoulder External Rotation and Scapular Retraction with Resistance  - 2 x daily - 7 x weekly - 3 sets - 10 reps - Shoulder Extension with Resistance - Palms Forward  - 1 x daily - 7 x weekly - 3 sets - 10 reps - Seated Scapular Retraction  - 1 x daily - 7 x weekly - 3 sets - 10 reps - Supine Cervical Retraction with Towel  - 1 x daily - 7 x weekly - 1 sets - 10 reps - Seated Levator Scapulae Stretch  - 1 x daily - 7 x weekly - 1  sets - 3 reps - 30 hold - Seated Cervical Sidebending Stretch  - 1 x daily - 7 x weekly - 1 sets - 3 reps - 30 hold - Doorway Pec Stretch at 90 Degrees Abduction  - 1 x daily - 7 x weekly - 3 sets - 10 reps - Sidelying Open Book Thoracic Lumbar Rotation and Extension  - 1 x daily - 7 x weekly - 2 sets - 10 reps Added 07-14-23 - Cat Cow to Child's Pose  - 1 x daily - 7 x weekly - 3 sets - 10 reps - [facility] with Gluteal Activation  - 1 x daily - 7 x weekly - 1 sets - 3 reps - 30 sec hold - Quadruped Adductor Stretch  - 1 x daily - 7 x weekly - 1 sets - 3 reps - 30-45 sec hold - Serratus Activation at Wall with Foam Roll and Resistance Band  - 1 x daily - 7 x weekly - 3 sets - 10 reps ASSESSMENT:  CLINICAL IMPRESSION: 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  He does not that he was able to back out of driveway with greater ease than before TPDN and evaluation.  Pt does report thoracic spasms of late and reports working out  every day with weights.   Pt does have a personal trainer and PT advised to cut back to 3-4 x a week.  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   Pt returns for TPDN for bil neck and and thoracic .and is closely monitored throughout  session.  Session today concentrates on education of body mechanics and posture for job specific tasks.   Also educated on importance of consistent strengthening in community with personal fitness trainer post DC. Will continue to progress as pt is able to tolerate for strengthening and mobility.    EVAL- Patient is a 63 y.o. male who was seen today for physical therapy evaluation and treatment for cervicalgia and thoracic neck pain and weekly headaches due to muscular tightness, abnormal posture, muscle weakness and decreased AROM. 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  He also is noticing rotational tightness with golfing and leisure activities.  Pt  will benefit from skilled PT to return to decreased headaches and more comfortable motion in daily activities and leisure activities..   OBJECTIVE IMPAIRMENTS: decreased activity tolerance, decreased ROM, decreased strength, increased fascial restrictions, impaired flexibility, improper body mechanics, postural dysfunction, and pain.   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   REHAB POTENTIAL: Good  CLINICAL DECISION MAKING: Stable/uncomplicated  EVALUATION COMPLEXITY: Low   GOALS: Goals reviewed with patient? Yes  SHORT TERM GOALS: Target date: 07-20-22  Pt will be independent with initial HEP Baseline: HEP issued Goal status: MET  2.  Patient will report neck/shoulder pain level </= 5/10 in order to reduce functional limitations and improve activity tolerance.  Baseline: Pt with pain in shoulders up to 10/10 when shoulder makes contact with wall,   also neck pain up to 6/10 with decreased movement and work tolerance Goal status: ONGOING  3.  Demonstrate understanding of proper sitting posture, body mechanics, work ergonomics, and be more conscious of position and posture throughout the day.  Baseline: Pt with forward head and work positions with poor body mechanics Goal status: ONGOING    LONG TERM GOALS: Target date: 08-25-22  Patient will be I with advanced HEP to maintain progress from PT.  Baseline: no knowledge Goal status: INITIAL  2.  Pt will experience 50% greater ease leisure activities such as golfing Baseline Pt with pain while golfing  Goal status: INITIAL  3.  Pt with reduction of headaches 2 x a month Baseline: Pt with weekly HA Goal status: INITIAL  4.   FOTO will improve from  83%  to  87%   indicating improved functional  mobility.  Baseline: 83% Goal status: INITIAL  5.  Improved cervical rotation to allow checking blind spots while driving with minimal discomfort Baseline: See AROM chart Goal status: INITIAL  6.  Pt will  improve shoulder AROM and MMT in order to use weight overhead with proper form Baseline: Not using weights routinely at present Goal status: INITIAL  7. Pt will be able to perform DNF supine test for at least 35 seconds to show improved neck strength  Baseline: 9.86 sec  Goal Status INITIAL   PLAN:  PT FREQUENCY: 1x/week  PT DURATION: 10 weeks  PLANNED INTERVENTIONS: 97164- PT Re-evaluation, 97110-Therapeutic exercises, 97530- Therapeutic activity, 97112- Neuromuscular re-education, 97535- Self Care, 82956- Manual therapy, 97014- Electrical stimulation (unattended), [id number]- Electrical stimulation (manual), Patient/Family education, Taping, Dry Needling, Joint mobilization, Spinal mobilization, Cryotherapy, and Moist heat  PLAN FOR NEXT SESSION: TPDN, progress neck and upper back and shoulder HEP as needed Manual  body mechanics and handout  scalenes next visit  Garen Lah, PT, ATRIC Certified Exercise Expert for the Aging Adult  08/16/23 1:30 PM Phone: [phone] Fax: [phone]

## 2023-08-16 ENCOUNTER — Ambulatory Visit: Payer: PRIVATE HEALTH INSURANCE | Admitting: Physical Therapy

## 2023-08-16 ENCOUNTER — Encounter: Payer: Self-pay | Admitting: Physical Therapy

## 2023-08-16 DIAGNOSIS — M546 Pain in thoracic spine: Secondary | ICD-10-CM

## 2023-08-16 DIAGNOSIS — R293 Abnormal posture: Secondary | ICD-10-CM

## 2023-08-16 DIAGNOSIS — M6281 Muscle weakness (generalized): Secondary | ICD-10-CM

## 2023-08-16 DIAGNOSIS — M542 Cervicalgia: Secondary | ICD-10-CM | POA: Diagnosis not present

## 2023-08-16 NOTE — Patient Instructions (Signed)
 Posture Tips DO: - stand tall and erect - keep chin tucked in - keep head and shoulders in alignment - check posture regularly in mirror or large window - pull head back against headrest in car seat;  Change your position often.  Sit with lumbar support. DON'T: - slouch or slump while watching TV or reading - sit, stand or lie in one position  for too long;  Sitting is especially hard on the spine so if you sit at a desk/use the computer, then stand up often!   Copyright  VHI. All rights reserved.  Posture - Standing   Good posture is important. Avoid slouching and forward head thrust. Maintain curve in low back and align ears over shoul- ders, hips over ankles.  Pull your belly button in toward your back bone.   Copyright  VHI. All rights reserved.  Posture - Sitting   Sit upright, head facing forward. Try using a roll to support lower back. Keep shoulders relaxed, and avoid rounded back. Keep hips level with knees. Avoid crossing legs for long periods.   Copyright  VHI. All rights reserved.   Sleeping on Back  Place pillow under knees. A pillow with cervical support and a roll around waist are also helpful. Copyright  VHI. All rights reserved.  Sleeping on Side Place pillow between knees. Use cervical support under neck and a roll around waist as needed. Copyright  VHI. All rights reserved.   Sleeping on Stomach   If this is the only desirable sleeping position, place pillow under lower legs, and under stomach or chest as needed.  Posture - Sitting   Sit upright, head facing forward. Try using a roll to support lower back. Keep shoulders relaxed, and avoid rounded back. Keep hips level with knees. Avoid crossing legs for long periods. Stand to Sit / Sit to Stand   To sit: Bend knees to lower self onto front edge of chair, then scoot back on seat. To stand: Reverse sequence by placing one foot forward, and scoot to front of seat. Use rocking motion to stand up.   Work  Height and Reach  Ideal work height is no more than 2 to 4 inches below elbow level when standing, and at elbow level when sitting. Reaching should be limited to arm's length, with elbows slightly bent.  Bending  Bend at hips and knees, not back. Keep feet shoulder-width apart.    Posture - Standing   Good posture is important. Avoid slouching and forward head thrust. Maintain curve in low back and align ears over shoul- ders, hips over ankles.  Alternating Positions   Alternate tasks and change positions frequently to reduce fatigue and muscle tension. Take rest breaks. Computer Work   Position work to Art gallery manager. Use proper work and seat height. Keep shoulders back and down, wrists straight, and elbows at right angles. Use chair that provides full back support. Add footrest and lumbar roll as needed.  Getting Into / Out of Car  Lower self onto seat, scoot back, then bring in one leg at a time. Reverse sequence to get out.  Dressing  Lie on back to pull socks or slacks over feet, or sit and bend leg while keeping back straight.    Housework - Sink  Place one foot on ledge of cabinet under sink when standing at sink for prolonged periods.   Pushing / Pulling  Pushing is preferable to pulling. Keep back in proper alignment, and use leg muscles to do  the work.  Deep Squat   Squat and lift with both arms held against upper trunk. Tighten stomach muscles without holding breath. Use smooth movements to avoid jerking.  Avoid Twisting   Avoid twisting or bending back. Pivot around using foot movements, and bend at knees if needed when reaching for articles.  Carrying Luggage   Distribute weight evenly on both sides. Use a cart whenever possible. Do not twist trunk. Move body as a unit.   Lifting Principles Maintain proper posture and head alignment. Slide object as close as possible before lifting. Move obstacles out of the way. Test before lifting; ask for  help if too heavy. Tighten stomach muscles without holding breath. Use smooth movements; do not jerk. Use legs to do the work, and pivot with feet. Distribute the work load symmetrically and close to the center of trunk. Push instead of pull whenever possible.   Ask For Help   Ask for help and delegate to others when possible. Coordinate your movements when lifting together, and maintain the low back curve.  Log Roll   Lying on back, bend left knee and place left arm across chest. Roll all in one movement to the right. Reverse to roll to the left. Always move as one unit. Housework - Sweeping  Use long-handled equipment to avoid stooping.   Housework - Wiping  Position yourself as close as possible to reach work surface. Avoid straining your back.  Laundry - Unloading Wash   To unload small items at bottom of washer, lift leg opposite to arm being used to reach.  Gardening - Raking  Move close to area to be raked. Use arm movements to do the work. Keep back straight and avoid twisting.     Cart  When reaching into cart with one arm, lift opposite leg to keep back straight.   Getting Into / Out of Bed  Lower self to lie down on one side by raising legs and lowering head at the same time. Use arms to assist moving without twisting. Bend both knees to roll onto back if desired. To sit up, start from lying on side, and use same move-ments in reverse. Housework - Vacuuming  Hold the vacuum with arm held at side. Step back and forth to move it, keeping head up. Avoid twisting.   Laundry - Armed forces training and education officer so that bending and twisting can be avoided.   Laundry - Unloading Dryer  Squat down to reach into clothes dryer or use a reacher.  Gardening - Weeding / Psychiatric nurse or Kneel. Knee pads may be helpful.                     Garen Lah, PT, ATRIC Certified Exercise Expert for the Aging Adult  08/16/23 11:48  AM Phone: [phone] Fax: [phone]

## 2023-08-23 ENCOUNTER — Ambulatory Visit: Payer: PRIVATE HEALTH INSURANCE | Attending: Internal Medicine | Admitting: Physical Therapy

## 2023-08-23 ENCOUNTER — Encounter: Payer: Self-pay | Admitting: Physical Therapy

## 2023-08-23 DIAGNOSIS — R293 Abnormal posture: Secondary | ICD-10-CM

## 2023-08-23 DIAGNOSIS — M6281 Muscle weakness (generalized): Secondary | ICD-10-CM

## 2023-08-23 DIAGNOSIS — M542 Cervicalgia: Secondary | ICD-10-CM

## 2023-08-23 DIAGNOSIS — M546 Pain in thoracic spine: Secondary | ICD-10-CM | POA: Diagnosis present

## 2023-08-23 NOTE — Therapy (Signed)
 OUTPATIENT PHYSICAL THERAPY CERVICAL TREATMENT   Patient Name: Colin Stanley MRN: [id number] DOB:1961-01-30, 63 y.o., male Today's Date: 08/23/2023  END OF SESSION:  PT End of Session - 08/23/23 1235     Visit Number 5    Number of Visits 12    Date for PT Re-Evaluation 08/25/23    Authorization Type [employer]    PT Start Time 1145    PT Stop Time 1232    PT Time Calculation (min) 47 min    Activity Tolerance Patient tolerated treatment well    Behavior During Therapy WFL for tasks assessed/performed                 Past Medical History:  Diagnosis Date   Dyslipidemia    Hypertension    Paroxysmal atrial fibrillation (HCC)    History reviewed. No pertinent surgical history. Patient Active Problem List   Diagnosis Date Noted   Palpitations 06/01/2019   Dyslipidemia 03/05/2009   Essential hypertension 03/05/2009   ATRIAL FIBRILLATION, PAROXYSMAL 03/05/2009    PCP: Colin Devon, MD   REFERRING PROVIDER: Andi Devon, MD   REFERRING DIAG: M25.511 (ICD-10-CM) - Pain in right shoulder   THERAPY DIAG:  Cervicalgia  Pain in thoracic spine  Muscle weakness (generalized)  Abnormal posture  Rationale for Evaluation and Treatment: Rehabilitation  ONSET DATE: about 20 years and job related  SUBJECTIVE:                                                                                                                                                                                                         SUBJECTIVE STATEMENT: I have a line of tightness on Right side into my sternum and dow my right back into my low back today. No pain but definite stiffness   EVAL- I have had tightness and pain in neck and upper back for years. 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  I also play golf.  But I have a lot of muscle knots in back for years.   I have headache once a week regularly. I work full time and I have pain after a week. I  feel tight when turning my neck to drive and to see some of my patients in dental chair. I really feel tight all the time and it has been for years   Hand dominance: Right  PERTINENT HISTORY:  ACL surgery . 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 Yes:  NPRS scale: tightness now. 6/10 pain  10/10 when shoulder hits doorway painful Pain location: neck/ rhomboids and upper back  Pain description: shooting pain up my spine when I hit bil shoulder (like running in to door Aggravating factors: after a day of dentistry mostly Thursday afternoons after a week headaches once a week in neck region  Relieving factors: advil  PRECAUTIONS: None  RED FLAGS: None     WEIGHT BEARING RESTRICTIONS: No  FALLS:  Has patient fallen in last 6 months? No  LIVING ENVIRONMENT: Lives with: lives with their spouse Lives in: House/apartment Stairs: no issue with stairs.  Has following equipment at home: None  OCCUPATION: Dentist  PLOF: Independent  PATIENT GOALS: to be more flexible reduced  NEXT MD VISIT: TBD  OBJECTIVE:  Note: Objective measures were completed at Evaluation unless otherwise noted.  DIAGNOSTIC FINDINGS:  None recent but by pt report  bulging disc at C 4/5 , 5/6 and 6/7  PATIENT SURVEYS:  FOTO 83%  predicited  77%  adusted to 87%  COGNITION: Overall cognitive status: Within functional limits for tasks assessed  SENSATION: Sometime transient paresthesia from  the bulging disc at night  POSTURE: rounded shoulders, forward head, and flexed trunk   PALPATION: Pt with moderate to severe tightness over bil UT/LS/ upper back/ rhomboids and cervical paraspinals   CERVICAL ROM:   Active ROM A/PROM (deg) eval AROM 09-12-23  Flexion 35   Extension 45   Right lateral flexion 20   Left lateral flexion 22   Right rotation 47 62  Left rotation 50 54   (Blank rows = not tested)  UPPER EXTREMITY ROM:  Active ROM Right eval  Left eval  Shoulder flexion 140 148  Shoulder extension    Shoulder abduction 140 155  Shoulder adduction    Shoulder extension    Shoulder internal rotation 80 90  Shoulder external rotation    Elbow flexion    Elbow extension    Wrist flexion    Wrist extension    Wrist ulnar deviation    Wrist radial deviation    Wrist pronation    Wrist supination     (Blank rows = not tested)  UPPER EXTREMITY MMT: grossly  4+/5 and overall muscular tightness  MMT Right eval Left eval  Shoulder flexion    Shoulder extension    Shoulder abduction    Shoulder adduction    Shoulder extension    Shoulder internal rotation    Shoulder external rotation    Middle trapezius    Lower trapezius    Elbow flexion    Elbow extension    Wrist flexion    Wrist extension    Wrist ulnar deviation    Wrist radial deviation    Wrist pronation    Wrist supination    Grip strength 110lb  115   (Blank rows = not tested)  CERVICAL SPECIAL TESTS:  Neck flexor muscle endurance test: Positive, Spurling's test: Negative, and Distraction test: Negative  FUNCTIONAL TESTS:  5 times sit to stand: 10.22 sec DNF test   9.86 sec  [facility] Adult PT Treatment:                                                DATE: 08-23-23 DNF test Therapeutic Exercise: OH press with 25 # KB x 10 on R and L  Around the [place] to R and to L with 25#  Pallof press 23 # on free motion Chopping 23 # free motion on R and L Lifting 23 # free motion on R and L Manual Therapy: STW R scalene, R Pec, R subclavius, R UT/LS  R T3 to T-11 R QL Rib mobs on R Myofascial release of R QL UPA T-3 to T-11 bil, end of range overpressure Trigger Point Dry Needling  Subsequent Treatment: Instructions reviewed, if requested by the patient, prior to subsequent dry needling treatment.   Patient Verbal Consent Given: Yes Education Handout Provided: Previously Provided Muscles Treated: R scalene, R Pec, R subclavius, R UT/LS  R T3 to T-11 R  QL Electrical Stimulation Performed: No Treatment Response/Outcome: muscle twitch response and decreased musle tension    [facility] Adult PT Treatment:                                                DATE: 08-16-23  Manual Therapy: STWbil  bil UT/LS, L and R rhomboids.  C-3  to T-11 Right and Left Right  Sub occipital release.   UPA C- 2 to C-7 bil, end of range overpressure Lumbar roll mobilization with cavitation  R sidelying and L sidelying Bil Scalenes Trigger Point Dry Needling  Subsequent Treatment: Instructions provided previously at initial dry needling treatment.   Patient Verbal Consent Given: Yes Education Handout Provided: Previously Provided Muscles Treated: bil UT/LS, L and R rhomboids.  C-3  to T-11 Right and Left Right  Sub occipital release.   Electrical Stimulation Performed: No Treatment Response/Outcome: Decreased tissue tension    Self Care: Posture education Lifting technique and safety  Body mechanics and specific for dental job duties [facility] Adult PT Treatment:                                                DATE: 08-09-23 Therapeutic Exercise: ER abd of bil shoulders and angel wings 2 x 15 with GTB Prone on shoulders and posterior neck with with chin tuck and GTB 2 x 15 Using KB 10 lb with sustained 90/90 ER abd of should and hold for 1 min on R and then L Manual Therapy: STW   RUT/LS,  R rhomboids.  C-3  to T-8 Right Right subscapularis, Right infraspinatus Sub occipital release.  UPA C- 2 to C-7 on Right, end of range overpressure Sidelyng on left  posterior shoulder release  Trigger Point Dry Needling  Subsequent Treatment: Instructions provided previously at initial dry needling treatment.  Instructions reviewed, if requested by the patient, prior to subsequent dry needling treatment.   Patient Verbal Consent Given: Yes Education Handout Provided: Yes Muscles Treated:  RUT/LS,  R rhomboids.  C-3  to T-8 Right Right subscapularis, Right  infraspinatus Electrical Stimulation Performed: No Treatment Response/Outcome: muscle tension decrease   Self Care: Discussed community wellness and continuing with trainer for ongoing consistent strengthening   [facility] Adult PT Treatment:  DATE: 07-14-23 Therapeutic Exercise: Cat Cow to Child's Pose  1 x 10 reps Pigeon Pose with Gluteal Activation  -1eps - 30 sec hold  Quadruped Adductor Stretch 3 reps - 30-45 sec hold Serratus Activation at Wall with Foam Roll and RTB Manual Therapy: STWbil  bil UT/LS, L and R rhomboids.  C-3  to T-8 Right , C-3 to T-8 Left  Right subscapularis Trigger Point Dry Needling  Subsequent Treatment: Instructions reviewed, if requested by the patient, prior to subsequent dry needling treatment.   Patient Verbal Consent Given: Yes Education Handout Provided: Previously Provided Muscles Treated: bil  bil UT/LS, L and R rhomboids.  C-3  to T-8 Right , C-3 to T-8 Left  Right subscapularis Electrical Stimulation Performed: 25 pps and intensity increased every 2 min at pt tolerance Treatment Response/Outcome: Twitch response elicited and Palpable decrease in muscle tension     TREATMENT DATE: 06-16-23  Eval and issue HEP  Trigger Point Dry-Needling  Treatment instructions: Expect mild to moderate muscle soreness. S/S of pneumothorax if dry needled over a lung field, and to seek immediate medical attention should they occur. Patient verbalized understanding of these instructions and education.  Patient Consent Given: Yes Education handout provided: Yes Muscles treated: bil UT/LS, L and R rhomboids. T- 4 to T-8 Right, Right subscapularis Electrical stimulation performed: No Parameters: N/A Treatment response/outcome: multiple Trigger point twitches and decreased muscle tension.                                                                                                                                  PATIENT  EDUCATION:  Education details: POC Explanation of findings, issue HEP, TPDN education Person educated: Patient Education method: Explanation, Demonstration, Tactile cues, Verbal cues, and Handouts Education comprehension: verbalized understanding, returned demonstration, verbal cues required, tactile cues required, and needs further education  HOME EXERCISE PROGRAM: Access Code: [email or web address] URL: [email or web address] Date: 06/16/2023 Prepared by: Garen Lah  Exercises - Shoulder External Rotation and Scapular Retraction with Resistance  - 2 x daily - 7 x weekly - 3 sets - 10 reps - Shoulder Extension with Resistance - Palms Forward  - 1 x daily - 7 x weekly - 3 sets - 10 reps - Seated Scapular Retraction  - 1 x daily - 7 x weekly - 3 sets - 10 reps - Supine Cervical Retraction with Towel  - 1 x daily - 7 x weekly - 1 sets - 10 reps - Seated Levator Scapulae Stretch  - 1 x daily - 7 x weekly - 1 sets - 3 reps - 30 hold - Seated Cervical Sidebending Stretch  - 1 x daily - 7 x weekly - 1 sets - 3 reps - 30 hold - Doorway Pec Stretch at 90 Degrees Abduction  - 1 x daily - 7 x weekly - 3 sets - 10 reps - Sidelying Open Book Thoracic Lumbar Rotation and Extension  - 1 x daily -  7 x weekly - 2 sets - 10 reps Added 07-14-23 - Cat Cow to Child's Pose  - 1 x daily - 7 x weekly - 3 sets - 10 reps - [employer] with Gluteal Activation  - 1 x daily - 7 x weekly - 1 sets - 3 reps - 30 sec hold - Quadruped Adductor Stretch  - 1 x daily - 7 x weekly - 1 sets - 3 reps - 30-45 sec hold - Serratus Activation at Wall with Foam Roll and Resistance Band  - 1 x daily - 7 x weekly - 3 sets - 10 reps ASSESSMENT:  CLINICAL IMPRESSION: Colin Stanley returns to clinic with no real pain  but stiffness and pulling stiffness from pectoral /sternal pain and in back from neck to thorac to upper low back. Pt does report that he used a chain saw this weekend.   He does not that he was able to back out of  driveway with greater ease than before TPDN and evaluation.  Colin Stanley states he has noticed more flexibility in his torso and neck but this line of pull and tightness is bothersome.   Pt returns for TPDN for bil neck and and thoracic and Pectoral mx .and is closely monitored throughout session.  Session also concentrated on exercise with weights and free motion machine. Will continue to progress as pt is able to tolerate for strengthening and mobility. Next visit with re eval.   EVAL- Patient is a 63 y.o. male who was seen today for physical therapy evaluation and treatment for cervicalgia and thoracic neck pain and weekly headaches due to muscular tightness, abnormal posture, muscle weakness and decreased AROM. 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  He also is noticing rotational tightness with golfing and leisure activities.  Pt will benefit from skilled PT to return to decreased headaches and more comfortable motion in daily activities and leisure activities..   OBJECTIVE IMPAIRMENTS: decreased activity tolerance, decreased ROM, decreased strength, increased fascial restrictions, impaired flexibility, improper body mechanics, postural dysfunction, and pain.   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   REHAB POTENTIAL: Good  CLINICAL DECISION MAKING: Stable/uncomplicated  EVALUATION COMPLEXITY: Low   GOALS: Goals reviewed with patient? Yes  SHORT TERM GOALS: Target date: 07-20-22  Pt will be independent with initial HEP Baseline: HEP issued Goal status: MET  2.  Patient will report neck/shoulder pain level </= 5/10 in order to reduce functional limitations and improve activity tolerance.  Baseline: Pt with  pain in shoulders up to 10/10 when shoulder makes contact with wall,   also neck pain up to 6/10 with decreased movement and work tolerance 08-23-23  Pt tightness but no pain Goal status: MET  3.  Demonstrate understanding of proper sitting posture, body mechanics, work ergonomics, and be more conscious of position and posture throughout the day.  Baseline: Pt with forward head and work positions with poor body mechanics Goal status: ONGOING    LONG TERM GOALS: Target date: 08-25-22  Patient will be I with advanced HEP to maintain progress from PT.  Baseline: no knowledge Goal status: ONGOING  2.  Pt will experience 50% greater ease leisure activities such as golfing Baseline Pt with pain while golfing  Goal status: ONGOING  3.  Pt with reduction of headaches 2 x a month Baseline: Pt with weekly HA Goal status: ONGOING  4.   FOTO will improve from  83%  to  87%   indicating improved functional mobility.  Baseline: 83% Goal status: ONGOING  5.  Improved cervical rotation to allow checking blind spots while driving with minimal discomfort Baseline: See AROM chart 08-24-23  improved cervical rotation see AROM chart Goal status: ONGOING  6.  Pt will  improve shoulder AROM and MMT in order to use weight overhead with proper form Baseline: Not using weights routinely at present Goal status: ONGOING  7. Pt will be able to perform DNF supine test for at least 35 seconds to show improved neck strength  Baseline: 9.86 sec  Goal Status ONGOING   PLAN:  PT FREQUENCY: 1x/week  PT DURATION: 10 weeks  PLANNED INTERVENTIONS: 97164- PT Re-evaluation, 97110-Therapeutic exercises, 97530- Therapeutic activity, 97112- Neuromuscular re-education, 97535- Self Care, 91478- Manual therapy, 97014- Electrical stimulation (unattended), 97032- Electrical stimulation (manual), Patient/Family education, Taping, Dry Needling, Joint mobilization, Spinal mobilization, Cryotherapy, and Moist heat  PLAN FOR  NEXT SESSION: TPDN, progress neck and upper back and shoulder HEP as needed Manual  body mechanics and handout  scalenes next visit   Garen Lah, PT, [facility] Certified Exercise Expert for the Aging Adult  08/23/23 12:59 PM Phone: [phone] Fax: [phone]

## 2023-08-25 NOTE — Therapy (Signed)
 OUTPATIENT PHYSICAL THERAPY CERVICAL TREATMENT/ERO   Patient Name: Colin Stanley MRN: [id number] DOB:Apr 28, 1961, 63 y.o., male Today's Date: 08/30/2023  END OF SESSION:  PT End of Session - 08/30/23 1154     Visit Number 6    Number of Visits 12    Date for PT Re-Evaluation 10/14/23    Authorization Type MEDCO[employer]    PT Start Time 1145    PT Stop Time 1232    PT Time Calculation (min) 47 min    Activity Tolerance Patient tolerated treatment well    Behavior During Therapy WFL for tasks assessed/performed                  Past Medical History:  Diagnosis Date   Dyslipidemia    Hypertension    Paroxysmal atrial fibrillation (HCC)    History reviewed. No pertinent surgical history. Patient Active Problem List   Diagnosis Date Noted   Palpitations 06/01/2019   Dyslipidemia 03/05/2009   Essential hypertension 03/05/2009   ATRIAL FIBRILLATION, PAROXYSMAL 03/05/2009    PCP: Andi Devon, MD   REFERRING PROVIDER: Andi Devon, MD   REFERRING DIAG: M25.511 (ICD-10-CM) - Pain in right shoulder   THERAPY DIAG:  Cervicalgia - Plan: PT plan of care cert/re-cert  Pain in thoracic spine - Plan: PT plan of care cert/re-cert  Muscle weakness (generalized) - Plan: PT plan of care cert/re-cert  Abnormal posture - Plan: PT plan of care cert/re-cert  Rationale for Evaluation and Treatment: Rehabilitation  ONSET DATE: about 20 years and job related  SUBJECTIVE:                                                                                                                                                                                                         SUBJECTIVE STATEMENT: I was in ED Friday morning to [facility] and checking for chest pain running from front of Right chest lower third of sternum along rib line to mid thoracic    EVAL- I have had tightness and pain in neck and upper back for years. 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  I am working  on my own health.  I also play golf.  But I have a lot of muscle knots in back for years.   I have headache once a week regularly. I work full time and I have pain after a week. I feel tight when turning my neck to drive and to see some of my patients in dental chair. I really feel tight all the time and it has been  for years   Hand dominance: Right  PERTINENT HISTORY:  ACL surgery . 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 Yes: NPRS scale: tightness now. 6/10 pain  10/10 when shoulder hits doorway painful Pain location: neck/ rhomboids and upper back  Pain description: shooting pain up my spine when I hit bil shoulder (like running in to door Aggravating factors: after a day of dentistry mostly Thursday afternoons after a week headaches once a week in neck region  Relieving factors: advil  PRECAUTIONS: None  RED FLAGS: None     WEIGHT BEARING RESTRICTIONS: No  FALLS:  Has patient fallen in last 6 months? No  LIVING ENVIRONMENT: Lives with: lives with their spouse Lives in: House/apartment Stairs: no issue with stairs.  Has following equipment at home: None  OCCUPATION: Dentist  PLOF: Independent  PATIENT GOALS: to be more flexible reduced  NEXT MD VISIT: TBD  OBJECTIVE:  Note: Objective measures were completed at Evaluation unless otherwise noted.  DIAGNOSTIC FINDINGS:  None recent but by pt report  bulging disc at C 4/5 , 5/6 and 6/7  PATIENT SURVEYS:  FOTO 83%  predicited  77%  adusted to 87% NDI 08-30-23  5/50 10% COGNITION: Overall cognitive status: Within functional limits for tasks assessed  SENSATION: Sometime transient paresthesia from  the bulging disc at night  POSTURE: rounded shoulders, forward head, and flexed trunk   PALPATION: Pt with moderate to severe tightness over bil UT/LS/ upper back/ rhomboids and cervical paraspinals   CERVICAL ROM:   Active ROM A/PROM (deg) eval AROM 09-12-23 AROM 08-30-23   Flexion 35  40  Extension 45  45  Right lateral flexion 20  31  Left lateral flexion 22  32  Right rotation 47 62 65  Left rotation 50 54 62   (Blank rows = not tested)  UPPER EXTREMITY ROM:  Active ROM Right eval Left eval AROM 08-30-23  Shoulder flexion 140 148 150/150  Shoulder extension     Shoulder abduction 140 155 150/155  Shoulder adduction     Shoulder extension     Shoulder internal rotation 80 90 80/90  Shoulder external rotation   90/90  Elbow flexion     Elbow extension     Wrist flexion     Wrist extension     Wrist ulnar deviation     Wrist radial deviation     Wrist pronation     Wrist supination      (Blank rows = not tested)  UPPER EXTREMITY MMT: grossly  4+/5 and overall muscular tightness  MMT Right eval Left eval R/L 08-30-23  Shoulder flexion   5/5  Shoulder extension     Shoulder abduction   5/5  Shoulder adduction     Shoulder extension     Shoulder internal rotation     Shoulder external rotation     Middle trapezius   4+/5  Lower trapezius     Elbow flexion     Elbow extension     Wrist flexion     Wrist extension     Wrist ulnar deviation     Wrist radial deviation     Wrist pronation     Wrist supination     Grip strength 110lb  115    (Blank rows = not tested)  CERVICAL SPECIAL TESTS:  Neck flexor muscle endurance test: Positive, Spurling's test: Negative, and Distraction test: Negative  FUNCTIONAL TESTS:  5  times sit to stand: 10.22 sec DNF test   9.86 sec 08-30-23  38.8 sec   [facility] Adult PT Treatment:                                                DATE: 08-30-23 Therapeutic Exercise: Deadlift 45 # KB  10 x Around the [place] to R and to L with 25#  Pallof press 23 # on free motion Chopping 23 # free motion on R and L Lifting 23 # free motion on R and L Row with 23 # bil Horizontal  abduction with snow angel on free motion 2 x 10 with 23 # Omega seated bench press  65 #  2 x 10 Omega pec press 45 # 2 x 10 Manual  Therapy: Myofascial release of R QL UPA T-3 to T-11 bil, end of range overpressure R side lumbar roll with thrust and cavitation L side lumbar roll with thrust and cavitation  [facility] Adult PT Treatment:                                                DATE: 08-23-23 DNF test Therapeutic Exercise: OH press with 25 # KB x 10 on R and L Around the world to R and to L with 25#  Pallof press 23 # on free motion Chopping 23 # free motion on R and L Lifting 23 # free motion on R and L Manual Therapy: STW R scalene, R Pec, R subclavius, R UT/LS  R T3 to T-11 R QL Rib mobs on R Myofascial release of R QL UPA T-3 to T-11 bil, end of range overpressure Trigger Point Dry Needling  Subsequent Treatment: Instructions reviewed, if requested by the patient, prior to subsequent dry needling treatment.   Patient Verbal Consent Given: Yes Education Handout Provided: Previously Provided Muscles Treated: R scalene, R Pec, R subclavius, R UT/LS  R T3 to T-11 R QL Electrical Stimulation Performed: No Treatment Response/Outcome: muscle twitch response and decreased musle tension      TREATMENT DATE: 06-16-23  Eval and issue HEP  Trigger Point Dry-Needling  Treatment instructions: Expect mild to moderate muscle soreness. S/S of pneumothorax if dry needled over a lung field, and to seek immediate medical attention should they occur. Patient verbalized understanding of these instructions and education.  Patient Consent Given: Yes Education handout provided: Yes Muscles treated: bil UT/LS, L and R rhomboids. T- 4 to T-8 Right, Right subscapularis Electrical stimulation performed: No Parameters: N/A Treatment response/outcome: multiple Trigger point twitches and decreased muscle tension.  PATIENT EDUCATION:  Education details: POC Explanation of findings, issue HEP, TPDN  education Person educated: Patient Education method: Explanation, Demonstration, Tactile cues, Verbal cues, and Handouts Education comprehension: verbalized understanding, returned demonstration, verbal cues required, tactile cues required, and needs further education  HOME EXERCISE PROGRAM: Access Code: [email or web address] URL: [email or web address] Date: 06/16/2023 Prepared by: Garen Lah  Exercises - Shoulder External Rotation and Scapular Retraction with Resistance  - 2 x daily - 7 x weekly - 3 sets - 10 reps - Shoulder Extension with Resistance - Palms Forward  - 1 x daily - 7 x weekly - 3 sets - 10 reps - Seated Scapular Retraction  - 1 x daily - 7 x weekly - 3 sets - 10 reps - Supine Cervical Retraction with Towel  - 1 x daily - 7 x weekly - 1 sets - 10 reps - Seated Levator Scapulae Stretch  - 1 x daily - 7 x weekly - 1 sets - 3 reps - 30 hold - Seated Cervical Sidebending Stretch  - 1 x daily - 7 x weekly - 1 sets - 3 reps - 30 hold - Doorway Pec Stretch at 90 Degrees Abduction  - 1 x daily - 7 x weekly - 3 sets - 10 reps - Sidelying Open Book Thoracic Lumbar Rotation and Extension  - 1 x daily - 7 x weekly - 2 sets - 10 reps Added 07-14-23 - Cat Cow to Child's Pose  - 1 x daily - 7 x weekly - 3 sets - 10 reps - [employer] with Gluteal Activation  - 1 x daily - 7 x weekly - 1 sets - 3 reps - 30 sec hold - Quadruped Adductor Stretch  - 1 x daily - 7 x weekly - 1 sets - 3 reps - 30-45 sec hold - Serratus Activation at Wall with Foam Roll and Resistance Band  - 1 x daily - 7 x weekly - 3 sets - 10 reps ASSESSMENT:  CLINICAL IMPRESSION: 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 Pt with no pneumothorax and no signs of MI.  Pt with probable intercostal muscle strain from coughing from recent respiratory virus.  Today he presents with decreased pain but is on Tramadol.  Pt declined TPDN and  was able to participate with exercises with no exacerbation of pain.  Pt today with ERO to extend visits to maximize strength and flexibility especially of thoracic spine for recreational pursuits such as golf.  Pt remains active and had shown increased flexibility and increase AROM of cervical spine.  Pt DNF supine test improved form 9 sec to 38.8 sec endurance and strength.   Session also concentrated on exercise with weights, gym equipment and free motion machine.  Pt will benefit from 6 sessions to reinforce HEP and maximize flexibility and strength   EVAL- Patient is a 63 y.o. male who was seen today for physical therapy evaluation and treatment for cervicalgia and thoracic neck pain and weekly headaches due to muscular tightness, abnormal posture, muscle weakness and decreased AROM. 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  He also is noticing rotational tightness with golfing and leisure activities.  Pt will benefit from skilled PT to return to decreased headaches and more comfortable motion in daily activities and leisure activities..   OBJECTIVE IMPAIRMENTS: decreased activity tolerance, decreased ROM, decreased strength, increased fascial restrictions,  impaired flexibility, improper body mechanics, postural dysfunction, and pain.   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   REHAB POTENTIAL: Good  CLINICAL DECISION MAKING: Stable/uncomplicated  EVALUATION COMPLEXITY: Low   GOALS: Goals reviewed with patient? Yes  SHORT TERM GOALS: Target date: 07-20-22  Pt will be independent with initial HEP Baseline: HEP issued Goal status: MET  2.  Patient will report neck/shoulder pain level </= 5/10 in order to reduce  functional limitations and improve activity tolerance.  Baseline: Pt with pain in shoulders up to 10/10 when shoulder makes contact with wall,   also neck pain up to 6/10 with decreased movement and work tolerance 08-23-23  Pt tightness but no pain Goal status: MET  3.  Demonstrate understanding of proper sitting posture, body mechanics, work ergonomics, and be more conscious of position and posture throughout the day.  Baseline: Pt with forward head and work positions with poor body mechanics Goal status: Deferred to LTG    LONG TERM GOALS: Target date: 08-25-22  Patient will be I with advanced HEP to maintain progress from PT.  Baseline: no knowledge Goal status: ONGOING  2.  Pt will experience 50% greater ease leisure activities such as golfing Baseline Pt with pain while golfing  08-30-23 Goal status: ONGOING  3.  Pt with reduction of headaches 2 x a month Baseline: Pt with weekly HA 08-30-23  no headaches Goal status: MET  4.   NDI will improve from  10%  to  5%   indicating improved functional mobility.  Baseline: 83% 08-30-23  NDI  5/50  10% Goal status: ONGOING  5.  Improved cervical rotation to allow checking blind spots while driving with minimal discomfort Baseline: See AROM chart 08-24-23  improved cervical rotation see AROM chart 08-30-23  Improved AROM neck See AROM chart Goal status: MET  6.  Pt will  improve shoulder AROM and MMT in order to use weight overhead with proper form Baseline: Not using weights routinely at present 08-30-23  Deadlifing 45#  working on lifting technique Goal status: ONGOING  7. Pt will be able to perform DNF supine test for at least 35 seconds to show improved neck strength  Baseline: 9.86 sec  08-30-23  38.8 sec  Goal Status MET  8.  Demonstrate understanding of proper sitting posture, body mechanics, work ergonomics, and be more conscious of position and posture throughout the day.   Baseline: Pt given handouts and reinforcing concepts  of lifting in clinc  Goal Status: ONGOING  9.  Pt will demostrate increased UE strength by perfroming OH press of 40 to 50 # to show increased strength.  Baseline  25 # KB OH press  Goal Status INITIAL   PLAN:  PT FREQUENCY: 1x/week  1 x a week  PT DURATION: 10 weeks  6 weeks  PLANNED INTERVENTIONS: 97164- PT Re-evaluation, 97110-Therapeutic exercises, 97530- Therapeutic activity, 97112- Neuromuscular re-education, 97535- Self Care, 96045- Manual therapy, 97014- Electrical stimulation (unattended), [phone]- Electrical stimulation (manual), Patient/Family education, Taping, Dry Needling, Joint mobilization, Spinal mobilization, Cryotherapy, and Moist heat  PLAN FOR NEXT SESSION: TPDN, progress neck and upper back and shoulder HEP as needed Manual  body mechanics and handout  scalenes next visit  Garen Lah, PT, ATRIC Certified Exercise Expert for the Aging Adult  08/30/23 2:32 PM Phone:  [phone] Fax: [phone]

## 2023-08-26 ENCOUNTER — Other Ambulatory Visit: Payer: Self-pay

## 2023-08-26 ENCOUNTER — Emergency Department (HOSPITAL_BASED_OUTPATIENT_CLINIC_OR_DEPARTMENT_OTHER): Payer: PRIVATE HEALTH INSURANCE

## 2023-08-26 ENCOUNTER — Emergency Department (HOSPITAL_BASED_OUTPATIENT_CLINIC_OR_DEPARTMENT_OTHER): Payer: PRIVATE HEALTH INSURANCE | Admitting: Radiology

## 2023-08-26 ENCOUNTER — Emergency Department (HOSPITAL_BASED_OUTPATIENT_CLINIC_OR_DEPARTMENT_OTHER)
Admission: EM | Admit: 2023-08-26 | Discharge: 2023-08-26 | Disposition: A | Discharge status: Home / Self Care | Payer: PRIVATE HEALTH INSURANCE | Attending: Emergency Medicine | Admitting: Emergency Medicine

## 2023-08-26 DIAGNOSIS — Z7982 Long term (current) use of aspirin: Secondary | ICD-10-CM | POA: Insufficient documentation

## 2023-08-26 DIAGNOSIS — J90 Pleural effusion, not elsewhere classified: Secondary | ICD-10-CM | POA: Insufficient documentation

## 2023-08-26 DIAGNOSIS — R0789 Other chest pain: Secondary | ICD-10-CM | POA: Diagnosis present

## 2023-08-26 DIAGNOSIS — Z79899 Other long term (current) drug therapy: Secondary | ICD-10-CM | POA: Insufficient documentation

## 2023-08-26 DIAGNOSIS — I2510 Atherosclerotic heart disease of native coronary artery without angina pectoris: Secondary | ICD-10-CM | POA: Diagnosis not present

## 2023-08-26 DIAGNOSIS — I10 Essential (primary) hypertension: Secondary | ICD-10-CM | POA: Insufficient documentation

## 2023-08-26 LAB — BASIC METABOLIC PANEL
Anion gap: 8 (ref 5–15)
BUN: 14 mg/dL (ref 8–23)
CO2: 29 mmol/L (ref 22–32)
Calcium: 8.8 mg/dL — ABNORMAL LOW (ref 8.9–10.3)
Chloride: 104 mmol/L (ref 98–111)
Creatinine, Ser: 0.87 mg/dL (ref 0.61–1.24)
GFR, Estimated: >60 mL/min (ref >60)
Glucose, Bld: 107 mg/dL — ABNORMAL HIGH (ref 70–99)
Potassium: 3.9 mmol/L (ref 3.5–5.1)
Sodium: 141 mmol/L (ref 135–145)

## 2023-08-26 LAB — CBC
HCT: 44.1 % (ref 39.0–52.0)
Hemoglobin: 15 g/dL (ref 13.0–17.0)
MCH: 29.6 pg (ref 26.0–34.0)
MCHC: 34 g/dL (ref 30.0–36.0)
MCV: 87 fL (ref 80.0–100.0)
Platelets: 221 10*3/uL (ref 150–400)
RBC: 5.07 MIL/uL (ref 4.22–5.81)
RDW: 13 % (ref 11.5–15.5)
WBC: 5.3 10*3/uL (ref 4.0–10.5)
nRBC: 0 % (ref 0.0–0.2)

## 2023-08-26 LAB — TROPONIN I (HIGH SENSITIVITY): Troponin I (High Sensitivity): 3 ng/L (ref <18)

## 2023-08-26 MED ORDER — IOHEXOL 350 MG/ML SOLN
75.0000 mL | Freq: Once | INTRAVENOUS | Status: AC | PRN
  Administered 2023-08-26: 75 mL via INTRAVENOUS

## 2023-08-26 NOTE — ED Provider Notes (Signed)
 [place] EMERGENCY DEPARTMENT AT [facility] Provider Note   CSN: [id number] Arrival date & time: 08/26/23  4098     History  Chief Complaint  Patient presents with   Chest Pain    Colin Stanley is a 63 y.o. male with past medical history significant for hypertension, A-fib, hyperlipidemia takes full dose aspirin who presents with concern for right sided chest pain radiating to right shoulder blade that has been present for around 5 weeks.  Patient reports that he has had some muscle tightness related to working out, and started getting dry needling on that side reports that he is had about 5 treatments, with most recent treatment rotating around the right side of the rib cage and right chest wall.  He reports worsening pain today compared to recent previous and wanted to make sure that everything was okay, and that he does not have any chest wall infection, pneumothorax.    Chest Pain      Home Medications Prior to Admission medications   Medication Sig Start Date End Date Taking? Authorizing Provider  aspirin 81 MG tablet Take 81 mg by mouth daily.    [provider]  carvedilol (COREG) 25 MG tablet TAKE 1 TABLET (25 MG TOTAL) BY MOUTH 2 (TWO) TIMES DAILY. TAKE WITH FOOD OR AFTER MEAL. 08/04/23 11/02/23  Marinus Maw, MD  finasteride (PROPECIA) 1 MG tablet Take 1 mg by mouth daily. 02/21/20   [provider]  losartan (COZAAR) 25 MG tablet Take 1 tablet (25 mg total) by mouth daily. 04/19/23 07/18/23  Marinus Maw, MD      Allergies    Latex    Review of Systems   Review of Systems  Cardiovascular:  Positive for chest pain.  All other systems reviewed and are negative.   Physical Exam Updated Vital Signs BP 137/83   Pulse 65   Temp 97.9 F (36.6 C) (Oral)   Resp 16   Ht 6\' 3"  (1.905 m)   Wt 102.1 kg   SpO2 97%   BMI 28.12 kg/m  Physical Exam Vitals and nursing note reviewed.  Constitutional:      General: He is not in acute  distress.    Appearance: Normal appearance.  HENT:     Head: Normocephalic and atraumatic.  Eyes:     General:        Right eye: No discharge.        Left eye: No discharge.  Cardiovascular:     Rate and Rhythm: Normal rate and regular rhythm.     Heart sounds: No murmur heard.    No friction rub. No gallop.  Pulmonary:     Effort: Pulmonary effort is normal.     Breath sounds: Normal breath sounds.     Comments: No wheezing, rhonchi, stridor, rales, no focal consolidation, breath sounds equal bilaterally. Chest:     Comments: Some reproducible tenderness to the palpation of right-sided chest wall, no step-off, deformity, no overlying vesicular skin rash Abdominal:     General: Bowel sounds are normal.     Palpations: Abdomen is soft.  Skin:    General: Skin is warm and dry.     Capillary Refill: Capillary refill takes less than 2 seconds.  Neurological:     Mental Status: He is alert and oriented to person, place, and time.  Psychiatric:        Mood and Affect: Mood normal.        Behavior: Behavior normal.  ED Results / Procedures / Treatments   Labs (all labs ordered are listed, but only abnormal results are displayed) Labs Reviewed  BASIC METABOLIC PANEL - Abnormal; Notable for the following components:      Result Value   Glucose, Bld 107 (*)    Calcium 8.8 (*)    All other components within normal limits  CBC  TROPONIN I (HIGH SENSITIVITY)    EKG EKG Interpretation Date/Time:  Friday August 26 2023 11:09:35 EST Ventricular Rate:  74 PR Interval:  181 QRS Duration:  97 QT Interval:  389 QTC Calculation: 432 R Axis:   62  Text Interpretation: Sinus rhythm No significant change since last tracing Confirmed by Alvira Monday ([id number]) on 08/26/2023 12:00:37 PM  Radiology CT Angio Chest PE W and/or Wo Contrast Result Date: 08/26/2023 CLINICAL DATA:  Right-sided chest pain radiating into the right shoulder EXAM: CT ANGIOGRAPHY CHEST WITH CONTRAST TECHNIQUE:  Multidetector CT imaging of the chest was performed using the standard protocol during bolus administration of intravenous contrast. Multiplanar CT image reconstructions and MIPs were obtained to evaluate the vascular anatomy. RADIATION DOSE REDUCTION: This exam was performed according to the departmental dose-optimization program which includes automated exposure control, adjustment of the mA and/or kV according to patient size and/or use of iterative reconstruction technique. CONTRAST:  75mL OMNIPAQUE IOHEXOL 350 MG/ML SOLN COMPARISON:  Same day chest radiograph, cardiac CT dated 09/25/2021 FINDINGS: Cardiovascular: The study is high quality for the evaluation of pulmonary embolism. There are no filling defects in the central, lobar, segmental or subsegmental pulmonary artery branches to suggest acute pulmonary embolism. Great vessels are normal in course and caliber. Normal heart size. No significant pericardial fluid/thickening. Coronary artery calcifications. Mediastinum/Nodes: Imaged thyroid gland without nodules meeting criteria for imaging follow-up by size. Normal esophagus. No pathologically enlarged axillary, supraclavicular, mediastinal, or hilar lymph nodes. Lungs/Pleura: The central airways are patent. No focal consolidation. No pneumothorax. Trace right pleural effusion. Upper abdomen: Scattered subcentimeter hypodensities throughout the liver, too small to characterize. Musculoskeletal: No acute or abnormal lytic or blastic osseous lesions. Multilevel degenerative changes of the thoracic spine. Review of the MIP images confirms the above findings. IMPRESSION: 1. No evidence of pulmonary embolism. 2. Trace right pleural effusion. 3. Coronary artery calcifications. Assessment for potential risk factor modification, dietary therapy or pharmacologic therapy may be warranted, if clinically indicated. Electronically Signed   By: Agustin Cree M.D.   On: 08/26/2023 15:04   DG Chest 2 View Result Date:  08/26/2023 CLINICAL DATA:  Chest pain and back pain for 1 week, worsening. EXAM: CHEST - 2 VIEW COMPARISON:  CT cardiac 09/25/2021. FINDINGS: The heart size and mediastinal contours are within normal limits. Linear opacities in the medial right lung base which may reflect atelectasis versus scarring. Lungs are otherwise radiographically clear. Degenerative changes in the visualized thoracic spine. IMPRESSION: Linear opacities in the medial right lung base, atelectasis versus scarring. Lungs otherwise radiographically clear. Degenerative changes in the thoracic spine. Electronically Signed   By: Emily Filbert M.D.   On: 08/26/2023 11:35    Procedures Procedures    Medications Ordered in ED Medications  iohexol (OMNIPAQUE) 350 MG/ML injection 75 mL (75 mLs Intravenous Contrast Given 08/26/23 1210)    ED Course/ Medical Decision Making/ A&P                                 Medical Decision Making Amount and/or Complexity of Data Reviewed  Labs: ordered. Radiology: ordered.  Risk Prescription drug management.   This patient is a 63 y.o. male  who presents to the ED for concern of right sided chest and right back pain.   Differential diagnoses prior to evaluation: The emergent differential diagnosis includes, but is not limited to,  ACS, AAS, PE, Mallory-Weiss, Boerhaave's, Pneumonia, acute bronchitis, asthma or COPD exacerbation, anxiety, MSK pain or traumatic injury to the chest, acid reflux versus other, suspect musculoskeletal injury given right sided pain, history of dry needling, nonexertional symptoms. This is not an exhaustive differential.   Past Medical History / Co-morbidities / Social History: Hypertension, hyperlipidemia, paroxysmal A-fib  Physical Exam: Physical exam performed. The pertinent findings include: Focal tenderness palpation of right side chest wall.  Patient does have some hypertension, blood pressure 165/93.  Vital signs otherwise stable in the emergency  department.  Lab Tests/Imaging studies: I personally interpreted labs/imaging and the pertinent results include: CBC unremarkable, BMP unremarkable, troponin negative x 1 in context of chest pain ongoing for several weeks, not left-sided in nature.  I independently interpreted plain film chest x-ray shows no evidence of acute intrathoracic abnormality question atelectasis versus scarring.  I independently interpreted CT PE scan which shows trace right pleural effusion, no PE, no scarring.  I agree with the radiologist interpretation.  Cardiac monitoring: EKG obtained and interpreted by myself and attending physician which shows: NSR, no acute st-t changes   Medications: Encouraged ibuprofen, Tylenol, close PCP follow-up, return precautions given   Disposition: After consideration of the diagnostic results and the patients response to treatment, I feel that patient able for discharge plan as above, no evidence of pneumothorax, chest wall infection, or other acute abnormality, question whether he could have had some mild chest wall/intercostal trauma which may have caused his pleural effusion, encourage close follow-up.   emergency department workup does not suggest an emergent condition requiring admission or immediate intervention beyond what has been performed at this time. The plan is: as above. The patient is safe for discharge and has been instructed to return immediately for worsening symptoms, change in symptoms or any other concerns.  Final Clinical Impression(s) / ED Diagnoses Final diagnoses:  Chest wall pain  Pleural effusion  Coronary artery calcification    Rx / DC Orders ED Discharge Orders     None         Olene Floss, PA-C 08/26/23 1518    Alvira Monday, MD 08/26/23 2342

## 2023-08-26 NOTE — ED Triage Notes (Signed)
 States has been having R sided CP that rads into R shoulder blade. Has been getting dry needling to help with the pain, but no relief. Hx of afib. Pain is worse with movement.

## 2023-08-26 NOTE — Discharge Instructions (Addendum)
 Please use Tylenol for pain.  You may use 1000 mg of Tylenol every 6 hours.  Not to exceed 4 g of Tylenol within 24 hours.  Please follow up closely with your PCP, return to the ED if worsening symptoms despite treatment.  Please return if you have significant worsening of pain despite treatment.  They noted some incidental coronary artery calcifications on your PET scan, you may want to talk to your primary care doctor about doing a more dedicated CT cardiac workup, other cardiac workup or talking about any medication adjustments that may be appropriate in this context, although I think your blood pressure and cholesterol medications right now would be appropriate.

## 2023-08-30 ENCOUNTER — Encounter: Payer: Self-pay | Admitting: Physical Therapy

## 2023-08-30 ENCOUNTER — Ambulatory Visit: Payer: PRIVATE HEALTH INSURANCE | Admitting: Physical Therapy

## 2023-08-30 DIAGNOSIS — R293 Abnormal posture: Secondary | ICD-10-CM

## 2023-08-30 DIAGNOSIS — M6281 Muscle weakness (generalized): Secondary | ICD-10-CM

## 2023-08-30 DIAGNOSIS — M542 Cervicalgia: Secondary | ICD-10-CM

## 2023-08-30 DIAGNOSIS — M546 Pain in thoracic spine: Secondary | ICD-10-CM

## 2023-09-08 NOTE — Therapy (Signed)
 OUTPATIENT PHYSICAL THERAPY CERVICAL TREATMENT/ERO   Patient Name: Colin Stanley MRN: [id number] DOB:January 07, 1961, 63 y.o., male Today's Date: 09/13/2023  END OF SESSION:  PT End of Session - 09/13/23 1251     Visit Number 7    Number of Visits 12    Date for PT Re-Evaluation 10/14/23    Authorization Type [employer]    PT Start Time 1153    PT Stop Time 1235    PT Time Calculation (min) 42 min    Activity Tolerance Patient tolerated treatment well    Behavior During Therapy WFL for tasks assessed/performed                   Past Medical History:  Diagnosis Date   Dyslipidemia    Hypertension    Paroxysmal atrial fibrillation (HCC)    History reviewed. No pertinent surgical history. Patient Active Problem List   Diagnosis Date Noted   Palpitations 06/01/2019   Dyslipidemia 03/05/2009   Essential hypertension 03/05/2009   ATRIAL FIBRILLATION, PAROXYSMAL 03/05/2009    PCP: Andi Devon, MD   REFERRING PROVIDER: Andi Devon, MD   REFERRING DIAG: M25.511 (ICD-10-CM) - Pain in right shoulder   THERAPY DIAG:  Cervicalgia  Pain in thoracic spine  Muscle weakness (generalized)  Abnormal posture  Rationale for Evaluation and Treatment: Rehabilitation  ONSET DATE: about 20 years and job related  SUBJECTIVE:                                                                                                                                                                                                         SUBJECTIVE STATEMENT: I just got back from vacation and played 36 holes of golf.  My trainer just had a baby and I have not worked out as much. I am still so stiff when I do my golf swings    EVAL- I have had tightness and pain in neck and upper back for years. 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  I also play golf.  But I have a lot of muscle knots in back for years.   I have headache once a week regularly. I work  full time and I have pain after a week. I feel tight when turning my neck to drive and to see some of my patients in dental chair. I really feel tight all the time and it has been for years   Hand dominance: Right  PERTINENT HISTORY:  ACL surgery . 10 years, HTN, Afib, bulging  disc in C-4, 5/6 and 6/7  PAIN:  Are you having pain? Yes: NPRS scale: tightness now. 6/10 pain  10/10 when shoulder hits doorway painful Pain location: neck/ rhomboids and upper back  Pain description: shooting pain up my spine when I hit bil shoulder (like running in to door Aggravating factors: after a day of dentistry mostly Thursday afternoons after a week headaches once a week in neck region  Relieving factors: advil  PRECAUTIONS: None  RED FLAGS: None     WEIGHT BEARING RESTRICTIONS: No  FALLS:  Has patient fallen in last 6 months? No  LIVING ENVIRONMENT: Lives with: lives with their spouse Lives in: House/apartment Stairs: no issue with stairs.  Has following equipment at home: None  OCCUPATION: Dentist  PLOF: Independent  PATIENT GOALS: to be more flexible reduced  NEXT MD VISIT: TBD  OBJECTIVE:  Note: Objective measures were completed at Evaluation unless otherwise noted.  DIAGNOSTIC FINDINGS:  None recent but by pt report  bulging disc at C 4/5 , 5/6 and 6/7  PATIENT SURVEYS:  FOTO 83%  predicited  77%  adusted to 87% NDI 08-30-23  5/50 10% COGNITION: Overall cognitive status: Within functional limits for tasks assessed  SENSATION: Sometime transient paresthesia from  the bulging disc at night  POSTURE: rounded shoulders, forward head, and flexed trunk   PALPATION: Pt with moderate to severe tightness over bil UT/LS/ upper back/ rhomboids and cervical paraspinals   CERVICAL ROM:   Active ROM A/PROM (deg) eval AROM 09-12-23 AROM 08-30-23  Flexion 35  40  Extension 45  45  Right lateral flexion 20  31  Left lateral flexion 22  32  Right rotation 47 62 65  Left  rotation 50 54 62   (Blank rows = not tested)  UPPER EXTREMITY ROM:  Active ROM Right eval Left eval AROM 08-30-23  Shoulder flexion 140 148 150/150  Shoulder extension     Shoulder abduction 140 155 150/155  Shoulder adduction     Shoulder extension     Shoulder internal rotation 80 90 80/90  Shoulder external rotation   90/90  Elbow flexion     Elbow extension     Wrist flexion     Wrist extension     Wrist ulnar deviation     Wrist radial deviation     Wrist pronation     Wrist supination      (Blank rows = not tested)  UPPER EXTREMITY MMT: grossly  4+/5 and overall muscular tightness  MMT Right eval Left eval R/L 08-30-23  Shoulder flexion   5/5  Shoulder extension     Shoulder abduction   5/5  Shoulder adduction     Shoulder extension     Shoulder internal rotation     Shoulder external rotation     Middle trapezius   4+/5  Lower trapezius     Elbow flexion     Elbow extension     Wrist flexion     Wrist extension     Wrist ulnar deviation     Wrist radial deviation     Wrist pronation     Wrist supination     Grip strength 110lb  115    (Blank rows = not tested)  CERVICAL SPECIAL TESTS:  Neck flexor muscle endurance test: Positive, Spurling's test: Negative, and Distraction test: Negative  FUNCTIONAL TESTS:  5 times sit to stand: 10.22 sec DNF test   9.86 sec 08-30-23  38.8 sec   [facility]  Adult PT Treatment:                                                DATE: 09-13-23 Therapeutic Exercise: Worlds greatest stretch to R and L Thread the needle [place]igeon stretch on elevated mat table 4 rounds of EMOMS for exercise 1) 10 x Gorilla rows with 30# KB on R and L 2) step ups with 25 # KB on R and L 3) squats 25 # KB x 20  Manual Therapy: Rib mobs on R and L Overstretch of Thread the needle to R and L PA mobs of spinous process T-2 to T-12 UPA on R and L thoracic PA mob C-3 to C-6 pa mob UPA on R and L C-3 to C6  [facility] Adult PT Treatment:                                                 DATE: 08-30-23 Therapeutic Exercise: Deadlift 45 # KB  10 x Around the world to R and to L with 25#  Pallof press 23 # on free motion Chopping 23 # free motion on R and L Lifting 23 # free motion on R and L Row with 23 # bil Horizontal  abduction with snow angel on free motion 2 x 10 with 23 # Omega seated bench press  65 #  2 x 10 Omega pec press 45 # 2 x 10 Manual Therapy: Myofascial release of R QL UPA T-3 to T-11 bil, end of range overpressure R side lumbar roll with thrust and cavitation L side lumbar roll with thrust and cavitation  [facility] Adult PT Treatment:                                                DATE: 08-23-23 DNF test Therapeutic Exercise: OH press with 25 # KB x 10 on R and L Around the world to R and to L with 25#  Pallof press 23 # on free motion Chopping 23 # free motion on R and L Lifting 23 # free motion on R and L Manual Therapy: STW R scalene, R Pec, R subclavius, R UT/LS  R T3 to T-11 R QL Rib mobs on R Myofascial release of R QL UPA T-3 to T-11 bil, end of range overpressure Trigger Point Dry Needling  Subsequent Treatment: Instructions reviewed, if requested by the patient, prior to subsequent dry needling treatment.   Patient Verbal Consent Given: Yes Education Handout Provided: Previously Provided Muscles Treated: R scalene, R Pec, R subclavius, R UT/LS  R T3 to T-11 R QL Electrical Stimulation Performed: No Treatment Response/Outcome: muscle twitch response and decreased musle tension      TREATMENT DATE: 06-16-23  Eval and issue HEP  Trigger Point Dry-Needling  Treatment instructions: Expect mild to moderate muscle soreness. S/S of pneumothorax if dry needled over a lung field, and to seek immediate medical attention should they occur. Patient verbalized understanding of these instructions and education.  Patient Consent Given: Yes Education handout provided: Yes Muscles treated: bil UT/LS, L and R rhomboids. T-  4 to T-8 Right, Right subscapularis Electrical stimulation performed: No Parameters: N/A Treatment response/outcome: multiple Trigger point twitches and decreased muscle tension.                                                                                                                                  PATIENT EDUCATION:  Education details: POC Explanation of findings, issue HEP, TPDN education Person educated: Patient Education method: Explanation, Demonstration, Tactile cues, Verbal cues, and Handouts Education comprehension: verbalized understanding, returned demonstration, verbal cues required, tactile cues required, and needs further education  HOME EXERCISE PROGRAM: Access Code: [email or web address] URL: [email or web address] Date: 06/16/2023 Prepared by: Garen Lah  Exercises - Shoulder External Rotation and Scapular Retraction with Resistance  - 2 x daily - 7 x weekly - 3 sets - 10 reps - Shoulder Extension with Resistance - Palms Forward  - 1 x daily - 7 x weekly - 3 sets - 10 reps - Seated Scapular Retraction  - 1 x daily - 7 x weekly - 3 sets - 10 reps - Supine Cervical Retraction with Towel  - 1 x daily - 7 x weekly - 1 sets - 10 reps - Seated Levator Scapulae Stretch  - 1 x daily - 7 x weekly - 1 sets - 3 reps - 30 hold - Seated Cervical Sidebending Stretch  - 1 x daily - 7 x weekly - 1 sets - 3 reps - 30 hold - Doorway Pec Stretch at 90 Degrees Abduction  - 1 x daily - 7 x weekly - 3 sets - 10 reps - Sidelying Open Book Thoracic Lumbar Rotation and Extension  - 1 x daily - 7 x weekly - 2 sets - 10 reps Added 07-14-23 - Cat Cow to Child's Pose  - 1 x daily - 7 x weekly - 3 sets - 10 reps - [employer] with Gluteal Activation  - 1 x daily - 7 x weekly - 1 sets - 3 reps - 30 sec hold - Quadruped Adductor Stretch  - 1 x daily - 7 x weekly - 1 sets - 3 reps - 30-45 sec hold - Serratus Activation at Wall with Foam Roll and Resistance Band  - 1 x daily - 7 x weekly -  3 sets - 10 reps ASSESSMENT:  CLINICAL IMPRESSION: 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 Pt with stiffness and no pain.  Pt was educated on [facility] for exercise to incorporate for cardio/strength for 4 rounds and then educated on flexibility/stretching exercises for full body.  Pt is trying to increase flexibility for more efficient swing  Pt will benefit from remaining sessions to reinforce HEP and maximize flexibility and strength   EVAL- Patient is a 63 y.o. male who was seen today for physical therapy evaluation and treatment for cervicalgia and thoracic neck pain and weekly headaches due to muscular tightness, abnormal  posture, muscle weakness and decreased AROM. 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  He also is noticing rotational tightness with golfing and leisure activities.  Pt will benefit from skilled PT to return to decreased headaches and more comfortable motion in daily activities and leisure activities..   OBJECTIVE IMPAIRMENTS: decreased activity tolerance, decreased ROM, decreased strength, increased fascial restrictions, impaired flexibility, improper body mechanics, postural dysfunction, and pain.   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   REHAB POTENTIAL: Good  CLINICAL DECISION MAKING: Stable/uncomplicated  EVALUATION COMPLEXITY: Low   GOALS: Goals reviewed with patient? Yes  SHORT TERM GOALS: Target date: 07-20-22  Pt will be independent with initial HEP Baseline: HEP issued Goal status: MET  2.  Patient will report neck/shoulder pain level </= 5/10 in order to reduce functional limitations and improve activity tolerance.   Baseline: Pt with pain in shoulders up to 10/10 when shoulder makes contact with wall,   also neck pain up to 6/10 with decreased movement and work tolerance 08-23-23  Pt tightness but no pain Goal status: MET  3.  Demonstrate understanding of proper sitting posture, body mechanics, work ergonomics, and be more conscious of position and posture throughout the day.  Baseline: Pt with forward head and work positions with poor body mechanics Goal status: Deferred to LTG    LONG TERM GOALS: Target date: 08-25-22  Patient will be I with advanced HEP to maintain progress from PT.  Baseline: no knowledge Goal status: ONGOING  2.  Pt will experience 50% greater ease leisure activities such as golfing Baseline Pt with pain while golfing  08-30-23 Goal status: ONGOING  3.  Pt with reduction of headaches 2 x a month Baseline: Pt with weekly HA 08-30-23  no headaches Goal status: MET  4.   NDI will improve from  10%  to  5%   indicating improved functional mobility.  Baseline: 83% 08-30-23  NDI  5/50  10% Goal status: ONGOING  5.  Improved cervical rotation to allow checking blind spots while driving with minimal discomfort Baseline: See AROM chart 08-24-23  improved cervical rotation see AROM chart 08-30-23  Improved AROM neck See AROM chart Goal status: MET  6.  Pt will  improve shoulder AROM and MMT in order to use weight overhead with proper form Baseline: Not using weights routinely at present 08-30-23  Deadlifing 45#  working on lifting technique Goal status: ONGOING  7. Pt will be able to perform DNF supine test for at least 35 seconds to show improved neck strength  Baseline: 9.86 sec  08-30-23  38.8 sec  Goal Status MET  8.  Demonstrate understanding of proper sitting posture, body mechanics, work ergonomics, and be more conscious of position and posture throughout the day.   Baseline: Pt given handouts and reinforcing concepts of lifting in clinc  Goal Status: ONGOING  9.  Pt  will demostrate increased UE strength by perfroming OH press of 40 to 50 # to show increased strength.  Baseline  25 # KB OH press  Goal Status INITIAL   PLAN:  PT FREQUENCY: 1x/week  1 x a week  PT DURATION: 10 weeks  6 weeks  PLANNED INTERVENTIONS: 97164- PT Re-evaluation, 97110-Therapeutic exercises, 97530- Therapeutic activity, [id number]- Neuromuscular re-education, 97535-  Self Care, 16109- Manual therapy, 97014- Electrical stimulation (unattended), [phone]- Electrical stimulation (manual), Patient/Family education, Taping, Dry Needling, Joint mobilization, Spinal mobilization, Cryotherapy, and Moist heat  PLAN FOR NEXT SESSION: TPDN, progress neck and upper back and shoulder HEP as needed Manual  body mechanics and handout  scalenes next visit  Garen Lah, PT, [facility] Certified Exercise Expert for the Aging Adult  09/13/23 1:03 PM Phone: [phone] Fax: [phone]

## 2023-09-13 ENCOUNTER — Ambulatory Visit: Payer: PRIVATE HEALTH INSURANCE | Admitting: Physical Therapy

## 2023-09-13 ENCOUNTER — Encounter: Payer: Self-pay | Admitting: Physical Therapy

## 2023-09-13 DIAGNOSIS — M6281 Muscle weakness (generalized): Secondary | ICD-10-CM

## 2023-09-13 DIAGNOSIS — M542 Cervicalgia: Secondary | ICD-10-CM | POA: Diagnosis not present

## 2023-09-13 DIAGNOSIS — R293 Abnormal posture: Secondary | ICD-10-CM

## 2023-09-13 DIAGNOSIS — M546 Pain in thoracic spine: Secondary | ICD-10-CM

## 2023-09-20 ENCOUNTER — Ambulatory Visit: Payer: PRIVATE HEALTH INSURANCE | Attending: Internal Medicine | Admitting: Physical Therapy

## 2023-09-20 ENCOUNTER — Telehealth: Payer: Self-pay | Admitting: Physical Therapy

## 2023-09-20 DIAGNOSIS — M6281 Muscle weakness (generalized): Secondary | ICD-10-CM | POA: Insufficient documentation

## 2023-09-20 DIAGNOSIS — M546 Pain in thoracic spine: Secondary | ICD-10-CM | POA: Insufficient documentation

## 2023-09-20 DIAGNOSIS — R293 Abnormal posture: Secondary | ICD-10-CM | POA: Insufficient documentation

## 2023-09-20 DIAGNOSIS — M542 Cervicalgia: Secondary | ICD-10-CM | POA: Insufficient documentation

## 2023-09-20 NOTE — Telephone Encounter (Signed)
 09-20-23  Pt called and stated he had called earlier that morning to cancel appt for 11:45.  Pt is aware of cancellation policy and has one more appt before DC.   Garen Lah, PT, [facility] Certified Exercise Expert for the Aging Adult  09/20/23 12:49 PM Phone: [phone] Fax: [phone]

## 2023-09-27 ENCOUNTER — Ambulatory Visit: Payer: PRIVATE HEALTH INSURANCE | Admitting: Physical Therapy

## 2023-09-27 DIAGNOSIS — M6281 Muscle weakness (generalized): Secondary | ICD-10-CM | POA: Diagnosis present

## 2023-09-27 DIAGNOSIS — R293 Abnormal posture: Secondary | ICD-10-CM | POA: Diagnosis present

## 2023-09-27 DIAGNOSIS — M546 Pain in thoracic spine: Secondary | ICD-10-CM | POA: Diagnosis present

## 2023-09-27 DIAGNOSIS — M542 Cervicalgia: Secondary | ICD-10-CM | POA: Diagnosis present

## 2023-09-27 NOTE — Therapy (Signed)
 OUTPATIENT PHYSICAL THERAPY CERVICAL TREATMENT   Patient Name: Colin Stanley MRN: [id number] DOB:[birth year], 63 y.o., male Today's Date: 09/27/2023  END OF SESSION:  PT End of Session - 09/27/23 1303     Visit Number 8    Number of Visits 12    Date for PT Re-Evaluation 10/14/23    Authorization Type [employer]    PT Start Time 1147    PT Stop Time 1230    PT Time Calculation (min) 43 min    Activity Tolerance Patient tolerated treatment well    Behavior During Therapy WFL for tasks assessed/performed                    Past Medical History:  Diagnosis Date   Dyslipidemia    Hypertension    Paroxysmal atrial fibrillation (HCC)    No past surgical history on file. Patient Active Problem List   Diagnosis Date Noted   Palpitations 06/01/2019   Dyslipidemia 03/05/2009   Essential hypertension 03/05/2009   ATRIAL FIBRILLATION, PAROXYSMAL 03/05/2009    PCP: Andi Devon, MD   REFERRING PROVIDER: Andi Devon, MD   REFERRING DIAG: M25.511 (ICD-10-CM) - Pain in right shoulder   THERAPY DIAG:  Cervicalgia  Pain in thoracic spine  Muscle weakness (generalized)  Abnormal posture  Rationale for Evaluation and Treatment: Rehabilitation  ONSET DATE: about 20 years and job related  SUBJECTIVE:                                                                                                                                                                                                         SUBJECTIVE STATEMENT: I haven't gone to two last massage therapist.  I will be out of town next week and back for my last visit. I am really tight in my thoracic spine    EVAL- I have had tightness and pain in neck and upper back for years. 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  I also play golf.  But I have a lot of muscle knots in back for years.   I have headache once a week regularly. I work full time and I have pain after a week.  I feel tight when turning my neck to drive and to see some of my patients in dental chair. I really feel tight all the time and it has been for years   Hand dominance: Right  PERTINENT HISTORY:  ACL surgery . 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 Yes: NPRS scale: tightness now. 6/10 pain  10/10 when shoulder hits doorway painful Pain location: neck/ rhomboids and upper back  Pain description: shooting pain up my spine when I hit bil shoulder (like running in to door Aggravating factors: after a day of dentistry mostly Thursday afternoons after a week headaches once a week in neck region  Relieving factors: advil  PRECAUTIONS: None  RED FLAGS: None     WEIGHT BEARING RESTRICTIONS: No  FALLS:  Has patient fallen in last 6 months? No  LIVING ENVIRONMENT: Lives with: lives with their spouse Lives in: House/apartment Stairs: no issue with stairs.  Has following equipment at home: None  OCCUPATION: Dentist  PLOF: Independent  PATIENT GOALS: to be more flexible reduced  NEXT MD VISIT: TBD  OBJECTIVE:  Note: Objective measures were completed at Evaluation unless otherwise noted.  DIAGNOSTIC FINDINGS:  None recent but by pt report  bulging disc at C 4/5 , 5/6 and 6/7  PATIENT SURVEYS:  FOTO 83%  predicited  77%  adusted to 87% NDI 08-30-23  5/50 10% COGNITION: Overall cognitive status: Within functional limits for tasks assessed  SENSATION: Sometime transient paresthesia from  the bulging disc at night  POSTURE: rounded shoulders, forward head, and flexed trunk   PALPATION: Pt with moderate to severe tightness over bil UT/LS/ upper back/ rhomboids and cervical paraspinals   CERVICAL ROM:   Active ROM A/PROM (deg) eval AROM 09-12-23 AROM 08-30-23  Flexion 35  40  Extension 45  45  Right lateral flexion 20  31  Left lateral flexion 22  32  Right rotation 47 62 65  Left rotation 50 54 62   (Blank rows = not  tested)  UPPER EXTREMITY ROM:  Active ROM Right eval Left eval AROM 08-30-23  Shoulder flexion 140 148 150/150  Shoulder extension     Shoulder abduction 140 155 150/155  Shoulder adduction     Shoulder extension     Shoulder internal rotation 80 90 80/90  Shoulder external rotation   90/90  Elbow flexion     Elbow extension     Wrist flexion     Wrist extension     Wrist ulnar deviation     Wrist radial deviation     Wrist pronation     Wrist supination      (Blank rows = not tested)  UPPER EXTREMITY MMT: grossly  4+/5 and overall muscular tightness  MMT Right eval Left eval R/L 08-30-23  Shoulder flexion   5/5  Shoulder extension     Shoulder abduction   5/5  Shoulder adduction     Shoulder extension     Shoulder internal rotation     Shoulder external rotation     Middle trapezius   4+/5  Lower trapezius     Elbow flexion     Elbow extension     Wrist flexion     Wrist extension     Wrist ulnar deviation     Wrist radial deviation     Wrist pronation     Wrist supination     Grip strength 110lb  115    (Blank rows = not tested)  CERVICAL SPECIAL TESTS:  Neck flexor muscle endurance test: Positive, Spurling's test: Negative, and Distraction test: Negative  FUNCTIONAL TESTS:  5 times sit to stand: 10.22 sec DNF test   9.86 sec 08-30-23  38.8 sec  [facility] Adult PT Treatment:  DATE: 09-27-23 Therapeutic Exercise: Prone Thoracic Rotation with Reach  3 reps - 10 sec hold Prone Thoracic Rotation with Hand Behind Head 3 reps - 10 sec hold Seated Assisted Cervical Rotation with Towel 3 reps - 15 sec hold TC and VC Push Up with Plus  10 reps with VC and TC Drawing Bow  BTB Bent over [employer] Crawl Pectoral stretch using two tennis balls taped for tack and stretch Supine rib and thoracic mobility with UE reach  Self Care: Discussion of thoracic mobility and application for job specifics and problem Psychologist, 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, community  [facility] Adult PT Treatment:                                                DATE: 09-13-23 Therapeutic Exercise: Worlds greatest stretch to R and L Thread the needle [place]on stretch on elevated mat table 4 rounds of EMOMS for exercise 1) 10 x Gorilla rows with 30# KB on R and L 2) step ups with 25 # KB on R and L 3) squats 25 # KB x 20  Manual Therapy: Rib mobs on R and L Overstretch of Thread the needle to R and L PA mobs of spinous process T-2 to T-12 UPA on R and L thoracic PA mob C-3 to C-6 pa mob UPA on R and L C-3 to C6  [facility] Adult PT Treatment:                                                DATE: 08-30-23 Therapeutic Exercise: Deadlift 45 # KB  10 x Around the world to R and to L with 25#  Pallof press 23 # on free motion Chopping 23 # free motion on R and L Lifting 23 # free motion on R and L Row with 23 # bil Horizontal  abduction with snow angel on free motion 2 x 10 with 23 # Omega seated bench press  65 #  2 x 10 Omega pec press 45 # 2 x 10 Manual Therapy: Myofascial release of R QL UPA T-3 to T-11 bil, end of range overpressure R side lumbar roll with thrust and cavitation L side lumbar roll with thrust and cavitation  [facility] Adult PT Treatment:                                                DATE: 08-23-23 DNF test Therapeutic Exercise: OH press with 25 # KB x 10 on R and L Around the world to R and to L with 25#  Pallof press 23 # on free motion Chopping 23 # free motion on R and L Lifting 23 # free motion on R and L Manual Therapy: STW R scalene, R Pec, R subclavius, R UT/LS  R T3 to T-11 R QL Rib mobs on R Myofascial release of R QL UPA T-3 to T-11 bil, end of range overpressure Trigger Point Dry Needling  Subsequent Treatment: Instructions reviewed, if requested by the patient, prior to subsequent dry needling treatment.   Patient Verbal Consent Given: Yes Education  Handout Provided: Previously Provided Muscles Treated: R  scalene, R Pec, R subclavius, R UT/LS  R T3 to T-11 R QL Electrical Stimulation Performed: No Treatment Response/Outcome: muscle twitch response and decreased musle tension      TREATMENT DATE: 06-16-23  Eval and issue HEP  Trigger Point Dry-Needling  Treatment instructions: Expect mild to moderate muscle soreness. S/S of pneumothorax if dry needled over a lung field, and to seek immediate medical attention should they occur. Patient verbalized understanding of these instructions and education.  Patient Consent Given: Yes Education handout provided: Yes Muscles treated: bil UT/LS, L and R rhomboids. T- 4 to T-8 Right, Right subscapularis Electrical stimulation performed: No Parameters: N/A Treatment response/outcome: multiple Trigger point twitches and decreased muscle tension.                                                                                                                                  PATIENT EDUCATION:  Education details: POC Explanation of findings, issue HEP, TPDN education Person educated: Patient Education method: Explanation, Demonstration, Tactile cues, Verbal cues, and Handouts Education comprehension: verbalized understanding, returned demonstration, verbal cues required, tactile cues required, and needs further education  HOME EXERCISE PROGRAM: Access Code: [email or web address] URL: [email or web address] Date: 06/16/2023 Prepared by: Garen Lah  Exercises - Shoulder External Rotation and Scapular Retraction with Resistance  - 2 x daily - 7 x weekly - 3 sets - 10 reps - Shoulder Extension with Resistance - Palms Forward  - 1 x daily - 7 x weekly - 3 sets - 10 reps - Seated Scapular Retraction  - 1 x daily - 7 x weekly - 3 sets - 10 reps - Supine Cervical Retraction with Towel  - 1 x daily - 7 x weekly - 1 sets - 10 reps - Seated Levator Scapulae Stretch  - 1 x daily - 7 x weekly - 1 sets - 3 reps - 30 hold - Seated Cervical Sidebending Stretch   - 1 x daily - 7 x weekly - 1 sets - 3 reps - 30 hold - Doorway Pec Stretch at 90 Degrees Abduction  - 1 x daily - 7 x weekly - 3 sets - 10 reps - Sidelying Open Book Thoracic Lumbar Rotation and Extension  - 1 x daily - 7 x weekly - 2 sets - 10 reps Added 07-14-23 - Cat Cow to Child's Pose  - 1 x daily - 7 x weekly - 3 sets - 10 reps - [facility] with Gluteal Activation  - 1 x daily - 7 x weekly - 1 sets - 3 reps - 30 sec hold - Quadruped Adductor Stretch  - 1 x daily - 7 x weekly - 1 sets - 3 reps - 30-45 sec hold - Serratus Activation at Wall with Foam Roll and Resistance Band  - 1 x daily - 7 x weekly - 3 sets - 10 reps  Thoracic specific  Access Code:  [email or web address] URL: [email or web address] Date: 09/27/2023 Prepared by: Garen Lah  Program Notes Plank crawl start on one side and rotate to other side maintaining plank position.  Do 10 times without losing plank as done (kind of ) in clinicRemember with thoracic mobility to lock out the lumbar to get more thoracic movement and isolate the thoracic area  Exercises - Prone Thoracic Rotation with Reach  - 1 x daily - 7 x weekly - 1 sets - 3 reps - 10 sec hold - Prone Thoracic Rotation with Hand Behind Head  - 1 x daily - 7 x weekly - 1 sets - 3 reps - 10 sec hold - Seated Assisted Cervical Rotation with Towel  - 1 x daily - 7 x weekly - 1 sets - 3 reps - 15 sec hold - Push Up with Plus  - 1 x daily - 7 x weekly - 3 sets - 10 reps - Drawing Bow  - 1 x daily - 7 x weekly - 3 sets - 10 reps ASSESSMENT:  CLINICAL IMPRESSION: 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  Pt session concentrated on thoracic mobility and strength and made new HEP for thoracic specific. Pt has one more appt before DC and will continue with personal trainer after DC from PT.   Pt is trying to increase flexibility for more efficient swing  Pt will benefit from  one remaining sessions to reinforce HEP  and maximize flexibility and strength   EVAL- Patient is a 63 y.o. male who was seen today for physical therapy evaluation and treatment for cervicalgia and thoracic neck pain and weekly headaches due to muscular tightness, abnormal posture, muscle weakness and decreased AROM. 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  He also is noticing rotational tightness with golfing and leisure activities.  Pt will benefit from skilled PT to return to decreased headaches and more comfortable motion in daily activities and leisure activities..   OBJECTIVE IMPAIRMENTS: decreased activity tolerance, decreased ROM, decreased strength, increased fascial restrictions, impaired flexibility, improper body mechanics, postural dysfunction, and pain.   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   REHAB POTENTIAL: Good  CLINICAL DECISION MAKING: Stable/uncomplicated  EVALUATION COMPLEXITY: Low   GOALS: Goals reviewed with patient? Yes  SHORT TERM GOALS: Target date: 07-20-22  Pt will be independent with initial HEP Baseline: HEP issued Goal status: MET  2.  Patient will report neck/shoulder pain level </= 5/10 in order to reduce functional limitations and improve activity tolerance.  Baseline: Pt with pain in shoulders up to 10/10 when shoulder makes contact with wall,   also neck pain up to 6/10 with decreased movement and work tolerance 08-23-23  Pt tightness but no pain Goal status: MET  3.  Demonstrate understanding of proper sitting posture, body mechanics, work ergonomics, and be more conscious of position and posture throughout the day.  Baseline: Pt with forward head and work positions with poor body mechanics Goal status:  Deferred to LTG    LONG TERM GOALS: Target date: 08-25-22  Patient will be I with advanced HEP to maintain progress from PT.  Baseline: no knowledge Goal status: ONGOING  2.  Pt will experience 50% greater ease leisure activities  such as golfing Baseline Pt with pain while golfing  09-27-23  Pt played and won in gold tournament previous weekend Goal status: MET  3.  Pt with reduction of headaches 2 x a month Baseline: Pt with weekly HA 08-30-23  no headaches Goal status: MET  4.   NDI will improve from  10%  to  5%   indicating improved functional mobility.  Baseline: 83% 08-30-23  NDI  5/50  10% Goal status: ONGOING  5.  Improved cervical rotation to allow checking blind spots while driving with minimal discomfort Baseline: See AROM chart 08-24-23  improved cervical rotation see AROM chart 08-30-23  Improved AROM neck See AROM chart Goal status: MET  6.  Pt will  improve shoulder AROM and MMT in order to use weight overhead with proper form Baseline: Not using weights routinely at present 08-30-23  Deadlifing 45#  working on lifting technique Goal status: ONGOING  7. Pt will be able to perform DNF supine test for at least 35 seconds to show improved neck strength  Baseline: 9.86 sec  08-30-23  38.8 sec  Goal Status MET  8.  Demonstrate understanding of proper sitting posture, body mechanics, work ergonomics, and be more conscious of position and posture throughout the day.   Baseline: Pt given handouts and reinforcing concepts of lifting in clinc  Goal Status: ONGOING  9.  Pt will demostrate increased UE strength by perfroming OH press of 40 to 50 # to show increased strength.  Baseline  25 # KB OH press  Goal Status ONGOING   PLAN:  PT FREQUENCY: 1x/week  1 x a week  PT DURATION: 10 weeks  6 weeks  PLANNED INTERVENTIONS: 97164- PT Re-evaluation, 97110-Therapeutic exercises, 97530- Therapeutic activity, 97112- Neuromuscular re-education, 97535- Self Care, 96045- Manual  therapy, 97014- Electrical stimulation (unattended), [phone]- Electrical stimulation (manual), Patient/Family education, Taping, Dry Needling, Joint mobilization, Spinal mobilization, Cryotherapy, and Moist heat  PLAN FOR NEXT SESSION: TPDN, progress neck and upper back and shoulder HEP as needed Manual  body mechanics and handout  scalenes next visit  Garen Lah, PT, [facility] Certified Exercise Expert for the Aging Adult  09/27/23 1:23 PM Phone: [phone] Fax: [phone]

## 2023-09-27 NOTE — Patient Instructions (Signed)
 All exercise for Colin Stanley   Access Code: [email or web address] URL: [email or web address] Date: 06/16/2023 Prepared by: Garen Lah  Exercises - Shoulder External Rotation and Scapular Retraction with Resistance  - 2 x daily - 7 x weekly - 3 sets - 10 reps - Shoulder Extension with Resistance - Palms Forward  - 1 x daily - 7 x weekly - 3 sets - 10 reps - Seated Scapular Retraction  - 1 x daily - 7 x weekly - 3 sets - 10 reps - Supine Cervical Retraction with Towel  - 1 x daily - 7 x weekly - 1 sets - 10 reps - Seated Levator Scapulae Stretch  - 1 x daily - 7 x weekly - 1 sets - 3 reps - 30 hold - Seated Cervical Sidebending Stretch  - 1 x daily - 7 x weekly - 1 sets - 3 reps - 30 hold - Doorway Pec Stretch at 90 Degrees Abduction  - 1 x daily - 7 x weekly - 3 sets - 10 reps - Sidelying Open Book Thoracic Lumbar Rotation and Extension  - 1 x daily - 7 x weekly - 2 sets - 10 reps Added 07-14-23 - Cat Cow to Child's Pose  - 1 x daily - 7 x weekly - 3 sets - 10 reps - [facility] with Gluteal Activation  - 1 x daily - 7 x weekly - 1 sets - 3 reps - 30 sec hold - Quadruped Adductor Stretch  - 1 x daily - 7 x weekly - 1 sets - 3 reps - 30-45 sec hold - Serratus Activation at Wall with Foam Roll and Resistance Band  - 1 x daily - 7 x weekly - 3 sets - 10 reps  Thoracic specific  Access Code: [email or web address] URL: [email or web address] Date: 09/27/2023 Prepared by: Garen Lah  Program Notes Plank crawl start on one side and rotate to other side maintaining plank position.  Do 10 times without losing plank as done (kind of ) in clinicRemember with thoracic mobility to lock out the lumbar to get more thoracic movement and isolate the thoracic area  Exercises - Prone Thoracic Rotation with Reach  - 1 x daily - 7 x weekly - 1 sets - 3 reps - 10 sec hold - Prone Thoracic Rotation with Hand Behind Head  - 1 x daily - 7 x weekly - 1 sets - 3 reps - 10 sec hold - Seated  Assisted Cervical Rotation with Towel  - 1 x daily - 7 x weekly - 1 sets - 3 reps - 15 sec hold - Push Up with Plus  - 1 x daily - 7 x weekly - 3 sets - 10 reps - Drawing Bow  - 1 x daily - 7 x weekly - 3 sets - 10 reps  Garen Lah, PT, [facility] Certified Exercise Expert for the Aging Adult  09/27/23 12:25 PM Phone: [phone] Fax: [phone]

## 2023-10-06 NOTE — Therapy (Signed)
 OUTPATIENT PHYSICAL THERAPY CERVICAL TREATMENT/DISCHARGE NOTE PHYSICAL THERAPY DISCHARGE SUMMARY  Visits from Start of Care: 9  Current functional level related to goals / functional outcomes: As indicated below.   Remaining deficits: Pt with global stiffness and hypomobility and no pain except did mention same right sternal rib pain that he has had checked out before with no cardiac abnormalities.     Education / Equipment: HEP   Patient agrees to discharge. Patient goals were met. Patient is being discharged due to meeting the stated rehab goals.  And pleased with current level of function   Patient Name: Colin Stanley MRN: [id number] DOB:01/02/1961, 63 y.o., male Today's Date: 10/11/2023  END OF SESSION:  PT End of Session - 10/11/23 1153     Visit Number 9    Number of Visits 12    Date for PT Re-Evaluation 10/14/23    Authorization Type [employer]    PT Start Time 1147    PT Stop Time 1230    PT Time Calculation (min) 43 min    Activity Tolerance Patient tolerated treatment well    Behavior During Therapy WFL for tasks assessed/performed                     Past Medical History:  Diagnosis Date   Dyslipidemia    Hypertension    Paroxysmal atrial fibrillation (HCC)    History reviewed. No pertinent surgical history. Patient Active Problem List   Diagnosis Date Noted   Palpitations 06/01/2019   Dyslipidemia 03/05/2009   Essential hypertension 03/05/2009   ATRIAL FIBRILLATION, PAROXYSMAL 03/05/2009    PCP: Yolanda Hence, MD   REFERRING PROVIDER: Yolanda Hence, MD   REFERRING DIAG: M25.511 (ICD-10-CM) - Pain in right shoulder   THERAPY DIAG:  Cervicalgia  Pain in thoracic spine  Muscle weakness (generalized)  Abnormal posture  Rationale for Evaluation and Treatment: Rehabilitation  ONSET DATE: about 20 years and job related  SUBJECTIVE:                                                                                                                                                                                                          SUBJECTIVE STATEMENT: I played golf on Sunday and the last 4 holes I got and used Voltaren on my joints and felt better. I am back with my trainer now.  No pain today except for a similar pain in rib sternal pain after he was golfing. Similar to when he went ot ER and checked cardiac symptoms but was cleared.  EVAL- I have had tightness and pain in neck and upper back for years. 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  I also play golf.  But I have a lot of muscle knots in back for years.   I have headache once a week regularly. I work full time and I have pain after a week. I feel tight when turning my neck to drive and to see some of my patients in dental chair. I really feel tight all the time and it has been for years   Hand dominance: Right  PERTINENT HISTORY:  ACL surgery . 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 Yes: NPRS scale: tightness now. 6/10 pain  10/10 when shoulder hits doorway painful Pain location: neck/ rhomboids and upper back  Pain description: shooting pain up my spine when I hit bil shoulder (like running in to door Aggravating factors: after a day of dentistry mostly Thursday afternoons after a week headaches once a week in neck region  Relieving factors: advil  PRECAUTIONS: None  RED FLAGS: None     WEIGHT BEARING RESTRICTIONS: No  FALLS:  Has patient fallen in last 6 months? No  LIVING ENVIRONMENT: Lives with: lives with their spouse Lives in: House/apartment Stairs: no issue with stairs.  Has following equipment at home: None  OCCUPATION: Dentist  PLOF: Independent  PATIENT GOALS: to be more flexible reduced  NEXT MD VISIT: TBD  OBJECTIVE:  Note: Objective measures were completed at Evaluation unless otherwise noted.  DIAGNOSTIC FINDINGS:  None recent but by pt  report  bulging disc at C 4/5 , 5/6 and 6/7  PATIENT SURVEYS:  FOTO 83%  predicited  77%  adusted to 87% NDI 08-30-23  5/50 10%  10-11-23  NDI 2 % COGNITION: Overall cognitive status: Within functional limits for tasks assessed  SENSATION: Sometime transient paresthesia from  the bulging disc at night  POSTURE: rounded shoulders, forward head, and flexed trunk   PALPATION: Pt with moderate to severe tightness over bil UT/LS/ upper back/ rhomboids and cervical paraspinals   CERVICAL ROM:   Active ROM A/PROM (deg) eval AROM 09-12-23 AROM 08-30-23 AROM 10-11-23  Flexion 35  40 45  Extension 45  45 45  Right lateral flexion 20  31 32  Left lateral flexion 22  32 32  Right rotation 47 62 65 65  Left rotation 50 54 62 69   (Blank rows = not tested)  UPPER EXTREMITY ROM:  Active ROM Right eval Left eval AROM 08-30-23 AROM 10-11-23  Shoulder flexion 140 148 150/150 155/155  Shoulder extension      Shoulder abduction 140 155 150/155 155/155  Shoulder adduction      Shoulder extension      Shoulder internal rotation 80 90 80/90   Shoulder external rotation   90/90 90/92  Elbow flexion      Elbow extension      Wrist flexion      Wrist extension      Wrist ulnar deviation      Wrist radial deviation      Wrist pronation      Wrist supination       (Blank rows = not tested)  UPPER EXTREMITY MMT: grossly  4+/5 and overall muscular tightness  MMT Right eval Left eval R/L 08-30-23 R/L 10-11-23  Shoulder flexion   5/5 5/5  Shoulder extension  Shoulder abduction   5/5 5/5  Shoulder adduction      Shoulder extension      Shoulder internal rotation      Shoulder external rotation      Middle trapezius   4+/5 5/5  Lower trapezius      Elbow flexion      Elbow extension      Wrist flexion      Wrist extension      Wrist ulnar deviation      Wrist radial deviation      Wrist pronation      Wrist supination      Grip strength 110lb  115     (Blank rows = not  tested)  CERVICAL SPECIAL TESTS:  Neck flexor muscle endurance test: Positive, Spurling's test: Negative, and Distraction test: Negative  FUNCTIONAL TESTS:  5 times sit to stand: 10.22 sec DNF test   9.86 sec 08-30-23  38.8 sec   [facility] Adult PT Treatment:                                                DATE: 10-11-23 Therapeutic Exercise: Reviewed thoracic HEP Prone Thoracic Rotation with Reach  3 reps - 10 sec hold Prone Thoracic Rotation with Hand Behind Head 3 reps - 10 sec hold Seated Assisted Cervical Rotation with Towel 3 reps - 15 sec hold TC and VC Push Up with Plus  10 reps with VC and TC Drawing Bow  BTB Bent over [employer] blade exercises and  use of power bands for resistance Manual Therapy: Rib mobs on R and L Overstretch of Thread the needle to R and L PA mobs of spinous process T-2 to T-12  Self Care: 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 S/S of pneumothorax if dry needled over a lung field, and to seek immediate medical attention should they occur. Patient verbalized understanding of these instructions and education.  Patient Consent Given: Yes Education handout provided: Yes Muscles treated: bil UT/LS, L and R rhomboids. T- 4 to T-8 Right, Right subscapularis Electrical stimulation performed: No Parameters: N/A Treatment response/outcome: multiple Trigger point twitches and decreased muscle tension.                                                                                                                                  PATIENT EDUCATION:  Education details: POC Explanation of findings, issue HEP, TPDN education Person educated: Patient Education method: Explanation, Demonstration, Tactile cues, Verbal cues, and Handouts Education comprehension: verbalized  understanding, returned demonstration, verbal cues required, tactile cues required, and needs further education  HOME  EXERCISE PROGRAM: Access Code: [email or web address] URL: [email or web address] Date: 06/16/2023 Prepared by: Sharlet Dawson  Exercises - Shoulder External Rotation and Scapular Retraction with Resistance  - 2 x daily - 7 x weekly - 3 sets - 10 reps - Shoulder Extension with Resistance - Palms Forward  - 1 x daily - 7 x weekly - 3 sets - 10 reps - Seated Scapular Retraction  - 1 x daily - 7 x weekly - 3 sets - 10 reps - Supine Cervical Retraction with Towel  - 1 x daily - 7 x weekly - 1 sets - 10 reps - Seated Levator Scapulae Stretch  - 1 x daily - 7 x weekly - 1 sets - 3 reps - 30 hold - Seated Cervical Sidebending Stretch  - 1 x daily - 7 x weekly - 1 sets - 3 reps - 30 hold - Doorway Pec Stretch at 90 Degrees Abduction  - 1 x daily - 7 x weekly - 3 sets - 10 reps - Sidelying Open Book Thoracic Lumbar Rotation and Extension  - 1 x daily - 7 x weekly - 2 sets - 10 reps Added 07-14-23 - Cat Cow to Child's Pose  - 1 x daily - 7 x weekly - 3 sets - 10 reps - [employer] with Gluteal Activation  - 1 x daily - 7 x weekly - 1 sets - 3 reps - 30 sec hold - Quadruped Adductor Stretch  - 1 x daily - 7 x weekly - 1 sets - 3 reps - 30-45 sec hold - Serratus Activation at Wall with Foam Roll and Resistance Band  - 1 x daily - 7 x weekly - 3 sets - 10 reps  Thoracic specific  Access Code: [email or web address] URL: [email or web address] Date: 09/27/2023 Prepared by: Sharlet Dawson  Program Notes Plank crawl start on one side and rotate to other side maintaining plank position.  Do 10 times without losing plank as done (kind of ) in clinicRemember with thoracic mobility to lock out the lumbar to get more thoracic movement and isolate the thoracic area  Exercises - Prone Thoracic Rotation with Reach  - 1 x daily - 7 x weekly - 1 sets - 3 reps - 10 sec hold - Prone Thoracic  Rotation with Hand Behind Head  - 1 x daily - 7 x weekly - 1 sets - 3 reps - 10 sec hold - Seated Assisted Cervical Rotation with Towel  - 1 x daily - 7 x weekly - 1 sets - 3 reps - 15 sec hold - Push Up with Plus  - 1 x daily - 7 x weekly - 3 sets - 10 reps - Drawing Bow  - 1 x daily - 7 x weekly - 3 sets - 10 reps ASSESSMENT:  CLINICAL IMPRESSION: 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 Pt has returned to personal trainer and has been playing golf with no pain in thoracic spine.  Pt is able to perform all HEP given and was able to have all questions answered to pt satisfaction. Dr Rosalene Colon is hypomobile but has AROM WNL. And 5/5 MMT.  Improved outcome measure of  NDI 2 % Pt session concentrated on thoracic mobility and strength. Pt is ready for DC at this time and all goals completed/achieved.   EVAL- Patient is a 63 y.o. male who was seen today for physical therapy evaluation and treatment for cervicalgia and thoracic neck pain and weekly headaches  due to muscular tightness, abnormal posture, muscle weakness and decreased AROM. 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  He also is noticing rotational tightness with golfing and leisure activities.  Pt will benefit from skilled PT to return to decreased headaches and more comfortable motion in daily activities and leisure activities..   OBJECTIVE IMPAIRMENTS: decreased activity tolerance, decreased ROM, decreased strength, increased fascial restrictions, impaired flexibility, improper body mechanics, postural dysfunction, and pain.   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   REHAB POTENTIAL: Good  CLINICAL DECISION MAKING:  Stable/uncomplicated  EVALUATION COMPLEXITY: Low   GOALS: Goals reviewed with patient? Yes  SHORT TERM GOALS: Target date: 07-20-22  Pt will be independent with initial HEP Baseline: HEP issued Goal status: MET  2.  Patient will report neck/shoulder pain level </= 5/10 in order to reduce functional limitations and improve activity tolerance.  Baseline: Pt with pain in shoulders up to 10/10 when shoulder makes contact with wall,   also neck pain up to 6/10 with decreased movement and work tolerance 08-23-23  Pt tightness but no pain Goal status: MET  3.  Demonstrate understanding of proper sitting posture, body mechanics, work ergonomics, and be more conscious of position and posture throughout the day.  Baseline: Pt with forward head and work positions with poor body mechanics Goal status: Deferred to LTG    LONG TERM GOALS: Target date: 08-25-22 extended to 10-11-23  Patient will be I with advanced HEP to maintain progress from PT.  Baseline: no knowledge Goal status: MET  2.  Pt will experience 50% greater ease leisure activities such as golfing Baseline Pt with pain while golfing  09-27-23  Pt played and won in gold tournament previous weekend Goal status: MET  3.  Pt with reduction of headaches 2 x a month Baseline: Pt with weekly HA 08-30-23  no headaches Goal status: MET  4.   NDI will improve from  10%  to  5%   indicating improved functional mobility.  Baseline: 83% 08-30-23  NDI  5/50  10% 10-11-23  NDI 2 % Goal status:MET  5.  Improved cervical rotation to allow checking blind spots while driving with minimal discomfort Baseline: See AROM chart 08-24-23  improved cervical rotation see AROM chart 08-30-23  Improved AROM neck See AROM chart Goal status: MET  6.  Pt will  improve shoulder AROM and MMT in order to use weight overhead with proper form Baseline: Not using weights routinely at present 08-30-23  Deadlifing 45#  working on lifting technique Goal status:  MET  7. Pt will be able to perform DNF supine test for at least 35 seconds to show improved neck strength  Baseline: 9.86 sec  08-30-23  38.8 sec  Goal Status MET  8.  Demonstrate understanding of proper sitting posture, body mechanics, work ergonomics, and be more conscious of position and posture throughout the day.   Baseline: Pt given handouts and reinforcing concepts of lifting in clinic 10-11-23 Pt understands posture and body mechanics especially for job as dentist   Goal Status:MET  9.  Pt will demostrate increased UE strength by perfroming OH press of 40 to 50 # to show increased strength.  Baseline  25 # KB OH press  10-11-23  Pt has  returned to personal trainer and OH press 40# and progressing with trainer  Goal Status MET   PLAN:  PT FREQUENCY: 1x/week  1 x a week  PT DURATION: 10 weeks  6 weeks  PLANNED INTERVENTIONS: 97164- PT Re-evaluation, 97110-Therapeutic exercises, 97530- Therapeutic activity, 97112- Neuromuscular re-education, 97535- Self Care, 74259- Manual therapy, 97014- Electrical stimulation (unattended), [phone]- Electrical stimulation (manual), Patient/Family education, Taping, Dry Needling, Joint mobilization, Spinal mobilization, Cryotherapy, and Moist heat  PLAN FOR NEXT SESSION: TPDN, progress neck and upper back and shoulder HEP as needed Manual  body mechanics and handout  scalenes next visit  Sharlet Dawson, PT, [facility] Certified Exercise Expert for the Aging Adult  10/11/23 2:32 PM Phone: [phone] Fax: [phone]

## 2023-10-11 ENCOUNTER — Ambulatory Visit: Payer: PRIVATE HEALTH INSURANCE | Admitting: Physical Therapy

## 2023-10-11 ENCOUNTER — Encounter: Payer: Self-pay | Admitting: Physical Therapy

## 2023-10-11 DIAGNOSIS — M6281 Muscle weakness (generalized): Secondary | ICD-10-CM

## 2023-10-11 DIAGNOSIS — M542 Cervicalgia: Secondary | ICD-10-CM

## 2023-10-11 DIAGNOSIS — M546 Pain in thoracic spine: Secondary | ICD-10-CM

## 2023-10-11 DIAGNOSIS — R293 Abnormal posture: Secondary | ICD-10-CM

## 2024-04-08 ENCOUNTER — Other Ambulatory Visit: Payer: Self-pay | Admitting: Internal Medicine

## 2024-05-07 ENCOUNTER — Other Ambulatory Visit: Payer: Self-pay | Admitting: Internal Medicine

## 2024-05-09 NOTE — Telephone Encounter (Signed)
 Please schedule overdue yearly appt for future refills [phone]. Thank you 2nd attempt

## 2024-06-10 ENCOUNTER — Other Ambulatory Visit: Payer: Self-pay | Admitting: Internal Medicine
